# Patient Record
Sex: Male | Born: 1965
Health system: Southern US, Community
[De-identification: ages and names within clinical notes are randomized; demographics above are authoritative.]

## PROBLEM LIST (undated history)

## (undated) DIAGNOSIS — G4730 Sleep apnea, unspecified: Secondary | ICD-10-CM

## (undated) DIAGNOSIS — J302 Other seasonal allergic rhinitis: Secondary | ICD-10-CM

## (undated) DIAGNOSIS — J329 Chronic sinusitis, unspecified: Secondary | ICD-10-CM

---

## 2011-06-09 HISTORY — PX: NASAL POLYP SURGERY: SHX186

## 2012-02-11 ENCOUNTER — Other Ambulatory Visit (HOSPITAL_COMMUNITY): Payer: Self-pay | Admitting: General Surgery

## 2012-02-11 DIAGNOSIS — IMO0002 Reserved for concepts with insufficient information to code with codable children: Secondary | ICD-10-CM

## 2012-02-15 ENCOUNTER — Other Ambulatory Visit (HOSPITAL_COMMUNITY): Payer: Self-pay | Admitting: General Surgery

## 2012-02-15 ENCOUNTER — Ambulatory Visit (HOSPITAL_COMMUNITY)
Admission: RE | Admit: 2012-02-15 | Discharge: 2012-02-15 | Disposition: A | Discharge status: Home / Self Care | Payer: Self-pay | Source: Ambulatory Visit | Attending: General Surgery | Admitting: General Surgery

## 2012-02-15 DIAGNOSIS — IMO0002 Reserved for concepts with insufficient information to code with codable children: Secondary | ICD-10-CM

## 2012-02-15 DIAGNOSIS — R1903 Right lower quadrant abdominal swelling, mass and lump: Secondary | ICD-10-CM | POA: Insufficient documentation

## 2012-02-15 MED ORDER — GADOBENATE DIMEGLUMINE 529 MG/ML IV SOLN
20.0000 mL | Freq: Once | INTRAVENOUS | Status: AC | PRN
  Administered 2012-02-15: 20 mL via INTRAVENOUS

## 2012-06-14 ENCOUNTER — Encounter (HOSPITAL_COMMUNITY): Payer: Self-pay | Admitting: Pharmacy Technician

## 2012-06-15 NOTE — Consult Note (Signed)
Terry Gonzalez, SCALES NO.:  [id number]  MEDICAL RECORD NO.:  [id number]  LOCATION:  [facility]                         FACILITY:  [facility]  PHYSICIAN:  Barbaraann Barthel, M.D. DATE OF BIRTH:  10-03-1965  DATE OF CONSULTATION:  06/14/2012 DATE OF DISCHARGE:                                CONSULTATION   DIAGNOSIS:  Abdominal mass, left lower quadrant.  HISTORY OF PRESENT MEDICAL ILLNESS:  The patient has had a mass that has increased in size over the last year.  I saw him in September and MRI showed large subcutaneous mass in the left lower quadrant.  Surgical exploration was advised. He has been unable to do this earlier and we planned to do this as an outpatient when he was able to do so.  He has had no fever or chills associated with this.  No loss of weight or other systemic symptoms.  PAST MEDICAL HISTORY:  Unremarkable.  PAST SURGICAL HISTORY:  In 2013, he had nasal polyps surgically removed.  He has no known allergies.  MEDICATIONS:  For his medications see medical med sheet.  He smokes 3 cigarettes a day, no more than this.  He is practicing [employer], does not drink.  PHYSICAL EXAMINATION:  VITAL SIGNS:  He is 6 feet tall, weighs 232 pounds, temperature is 98.1, pulse rate is 85 and regular, respirations 24, blood pressure 131/86. HEENT:  Head is normocephalic.  Eyes, extraocular movements are intact. Nasal mucosa appears somewhat erythematous.  There is a history of past nasal polyp surgery.  His oral and nasal mucosa is moist. NECK:  Supple and cylindrical without jugular vein distention, thyromegaly, tracheal deviation, or the presence of bruits or adenopathy. CHEST:  Clear, both anterior and posterior auscultation. HEART:  Regular rhythm. ABDOMEN  Soft.  The patient has no femoral or inguinal hernias appreciated.  He has a mass appreciated in the left lower quadrant.  It feels smooth and firm and mobile.  We have coordinated these findings with the  MRI. RECTAL EXAMINATION:  Deferred. EXTREMITIES:  Within normal limits.  REVIEW OF SYSTEMS:  NEURO SYSTEM:  No history of migraines or seizures. ENDOCRINE SYSTEM:  No history of diabetes or thyroid disease. CARDIOPULMONARY SYSTEM:  Grossly within normal limits.  Mild history of tobacco use, but not abuse.  MUSCULOSKELETAL SYSTEM:  The patient is overweight.  GI SYSTEM:  No history of hepatitis, constipation, diarrhea, or bright red rectal bleeding.  History of bowel changes or history of any acute inflammatory bowel disease or irritable bowel syndrome.  No unexplained weight loss.  The patient has never had a colonoscopy.  GU SYSTEM:  No history of dysuria or frequency or nephrolithiasis.  REVIEW OF HISTORY AND PHYSICAL:  Therefore, Mr. Nicolini is a 47 year old, [place] American who has a mass noted for about a year in his left lower quadrant and we will plan for surgical exploration.  We discussed complications not limited to, but including bleeding, infection, and the possibility of further surgery might be required.  Informed consent was obtained.     Barbaraann Barthel, M.D.     WB/MEDQ  D:  06/14/2012  T:  06/15/2012  Job:  [id number]

## 2012-06-16 ENCOUNTER — Other Ambulatory Visit (HOSPITAL_COMMUNITY): Payer: Self-pay

## 2012-06-16 NOTE — Patient Instructions (Addendum)
    Bryton Pals  06/16/2012   Your procedure is scheduled on:   06/20/2012  Report to [facility] at  615  AM.  Call this number if you have problems the morning of surgery: [phone]   Remember:   Do not eat food or drink liquids after midnight.   Take these medicines the morning of surgery with A SIP OF WATER: none   Do not wear jewelry, make-up or nail polish.  Do not wear lotions, powders, or perfumes.   Do not shave 48 hours prior to surgery. Men may shave face and neck.  Do not bring valuables to the hospital.  Contacts, dentures or bridgework may not be worn into surgery.  Leave suitcase in the car. After surgery it may be brought to your room.  For patients admitted to the hospital, checkout time is 11:00 AM the day of discharge.   Patients discharged the day of surgery will not be allowed to drive  home.  Name and phone number of your driver: family  Special Instructions: Shower using CHG 2 nights before surgery and the night before surgery.  If you shower the day of surgery use CHG.  Use special wash - you have one bottle of CHG for all showers.  You should use approximately 1/3 of the bottle for each shower.   Please read over the following fact sheets that you were given: Pain Booklet, Coughing and Deep Breathing, MRSA Information, Surgical Site Infection Prevention, Anesthesia Post-op Instructions and Care and Recovery After Surgery PATIENT INSTRUCTIONS POST-ANESTHESIA  IMMEDIATELY FOLLOWING SURGERY:  Do not drive or operate machinery for the first twenty four hours after surgery.  Do not make any important decisions for twenty four hours after surgery or while taking narcotic pain medications or sedatives.  If you develop intractable nausea and vomiting or a severe headache please notify your doctor immediately.  FOLLOW-UP:  Please make an appointment with your surgeon as instructed. You do not need to follow up with anesthesia unless specifically instructed to do so.  WOUND  CARE INSTRUCTIONS (if applicable):  Keep a dry clean dressing on the anesthesia/puncture wound site if there is drainage.  Once the wound has quit draining you may leave it open to air.  Generally you should leave the bandage intact for twenty four hours unless there is drainage.  If the epidural site drains for more than 36-48 hours please call the anesthesia department.  QUESTIONS?:  Please feel free to call your physician or the hospital operator if you have any questions, and they will be happy to assist you.

## 2012-06-17 ENCOUNTER — Encounter (HOSPITAL_COMMUNITY): Payer: Self-pay

## 2012-06-17 ENCOUNTER — Encounter (HOSPITAL_COMMUNITY)
Admission: RE | Admit: 2012-06-17 | Discharge: 2012-06-17 | Disposition: A | Discharge status: Home / Self Care | Payer: Self-pay | Source: Ambulatory Visit | Attending: General Surgery | Admitting: General Surgery

## 2012-06-17 HISTORY — DX: Sleep apnea, unspecified: G47.30

## 2012-06-17 LAB — BASIC METABOLIC PANEL
BUN: 12 mg/dL (ref 6–23)
CO2: 28 mEq/L (ref 19–32)
Calcium: 9.3 mg/dL (ref 8.4–10.5)
Creatinine, Ser: 0.87 mg/dL (ref 0.50–1.35)
GFR calc non Af Amer: >90 mL/min (ref >90)
Glucose, Bld: 113 mg/dL — ABNORMAL HIGH (ref 70–99)
Sodium: 138 mEq/L (ref 135–145)

## 2012-06-17 LAB — CBC WITH DIFFERENTIAL/PLATELET
Eosinophils Absolute: 0.3 10*3/uL (ref 0.0–0.7)
Eosinophils Relative: 7 % — ABNORMAL HIGH (ref 0–5)
HCT: 43.7 % (ref 39.0–52.0)
Lymphocytes Relative: 57 % — ABNORMAL HIGH (ref 12–46)
Lymphs Abs: 2.3 10*3/uL (ref 0.7–4.0)
MCH: 30.2 pg (ref 26.0–34.0)
MCV: 86.2 fL (ref 78.0–100.0)
Monocytes Absolute: 0.4 10*3/uL (ref 0.1–1.0)
Platelets: 238 10*3/uL (ref 150–400)
RBC: 5.07 MIL/uL (ref 4.22–5.81)
WBC: 4.1 10*3/uL (ref 4.0–10.5)

## 2012-06-17 NOTE — Progress Notes (Signed)
06/17/12 1017  OBSTRUCTIVE SLEEP APNEA  Have you ever been diagnosed with sleep apnea through a sleep study? No  Do you snore loudly (loud enough to be heard through closed doors)?  1  Do you often feel tired, fatigued, or sleepy during the daytime? 1  Has anyone observed you stop breathing during your sleep? 1  Do you have, or are you being treated for high blood pressure? 0  BMI more than 35 kg/m2? 0  Age over 49 years old? 0  Neck circumference greater than 40 cm/18 inches? 1  Gender: 1  Obstructive Sleep Apnea Score 5   Score 4 or greater  Results sent to PCP;No PCP

## 2012-06-20 ENCOUNTER — Encounter (HOSPITAL_COMMUNITY): Payer: Self-pay | Admitting: Anesthesiology

## 2012-06-20 ENCOUNTER — Ambulatory Visit (HOSPITAL_COMMUNITY)
Admission: RE | Admit: 2012-06-20 | Discharge: 2012-06-20 | Disposition: A | Discharge status: Home / Self Care | Payer: Self-pay | Source: Ambulatory Visit | Attending: General Surgery | Admitting: General Surgery

## 2012-06-20 ENCOUNTER — Encounter (HOSPITAL_COMMUNITY): Payer: Self-pay | Admitting: *Deleted

## 2012-06-20 ENCOUNTER — Encounter (HOSPITAL_COMMUNITY)
Admission: RE | Disposition: A | Discharge status: Home / Self Care | Payer: Self-pay | Source: Ambulatory Visit | Attending: General Surgery

## 2012-06-20 ENCOUNTER — Ambulatory Visit (HOSPITAL_COMMUNITY): Payer: Self-pay | Admitting: Anesthesiology

## 2012-06-20 DIAGNOSIS — R1904 Left lower quadrant abdominal swelling, mass and lump: Secondary | ICD-10-CM | POA: Insufficient documentation

## 2012-06-20 DIAGNOSIS — D214 Benign neoplasm of connective and other soft tissue of abdomen: Secondary | ICD-10-CM | POA: Insufficient documentation

## 2012-06-20 HISTORY — PX: MASS EXCISION: SHX2000

## 2012-06-20 SURGERY — EXCISION MASS
Anesthesia: General | Site: Abdomen | Laterality: Left | Wound class: Clean

## 2012-06-20 MED ORDER — MIDAZOLAM HCL 2 MG/2ML IJ SOLN
1.0000 mg | INTRAMUSCULAR | Status: DC | PRN
  Administered 2012-06-20: 2 mg via INTRAVENOUS

## 2012-06-20 MED ORDER — FENTANYL CITRATE 0.05 MG/ML IJ SOLN
INTRAMUSCULAR | Status: DC | PRN
  Administered 2012-06-20 (×2): 50 ug via INTRAVENOUS

## 2012-06-20 MED ORDER — LIDOCAINE HCL (PF) 1 % IJ SOLN
INTRAMUSCULAR | Status: AC
  Filled 2012-06-20: qty 5

## 2012-06-20 MED ORDER — CEFAZOLIN SODIUM-DEXTROSE 2-3 GM-% IV SOLR
INTRAVENOUS | Status: AC
  Filled 2012-06-20: qty 50

## 2012-06-20 MED ORDER — CEFAZOLIN SODIUM-DEXTROSE 2-3 GM-% IV SOLR: 2.0000 g | INTRAVENOUS | Status: DC

## 2012-06-20 MED ORDER — 0.9 % SODIUM CHLORIDE (POUR BTL) OPTIME
TOPICAL | Status: DC | PRN
  Administered 2012-06-20: 1000 mL

## 2012-06-20 MED ORDER — FENTANYL CITRATE 0.05 MG/ML IJ SOLN
INTRAMUSCULAR | Status: AC
  Filled 2012-06-20: qty 2

## 2012-06-20 MED ORDER — MIDAZOLAM HCL 2 MG/2ML IJ SOLN
INTRAMUSCULAR | Status: AC
  Filled 2012-06-20: qty 2

## 2012-06-20 MED ORDER — LIDOCAINE HCL 1 % IJ SOLN
INTRAMUSCULAR | Status: DC | PRN
  Administered 2012-06-20: 50 mg via INTRADERMAL

## 2012-06-20 MED ORDER — BUPIVACAINE HCL (PF) 0.5 % IJ SOLN
INTRAMUSCULAR | Status: AC
  Filled 2012-06-20: qty 30

## 2012-06-20 MED ORDER — ONDANSETRON HCL 4 MG/2ML IJ SOLN
4.0000 mg | Freq: Once | INTRAMUSCULAR | Status: AC
  Administered 2012-06-20: 4 mg via INTRAVENOUS

## 2012-06-20 MED ORDER — CEFAZOLIN SODIUM-DEXTROSE 2-3 GM-% IV SOLR
INTRAVENOUS | Status: DC | PRN
  Administered 2012-06-20: 2 g via INTRAVENOUS

## 2012-06-20 MED ORDER — LACTATED RINGERS IV SOLN
INTRAVENOUS | Status: DC
  Administered 2012-06-20: 1000 mL via INTRAVENOUS

## 2012-06-20 MED ORDER — MIDAZOLAM HCL 5 MG/5ML IJ SOLN
INTRAMUSCULAR | Status: DC | PRN
  Administered 2012-06-20: 2 mg via INTRAVENOUS

## 2012-06-20 MED ORDER — PROPOFOL 10 MG/ML IV EMUL
INTRAVENOUS | Status: AC
  Filled 2012-06-20: qty 20

## 2012-06-20 MED ORDER — PROPOFOL 10 MG/ML IV BOLUS
INTRAVENOUS | Status: DC | PRN
  Administered 2012-06-20: 150 mg via INTRAVENOUS

## 2012-06-20 MED ORDER — ONDANSETRON HCL 4 MG/2ML IJ SOLN: 4.0000 mg | Freq: Once | INTRAMUSCULAR | Status: DC | PRN

## 2012-06-20 MED ORDER — ONDANSETRON HCL 4 MG/2ML IJ SOLN
INTRAMUSCULAR | Status: AC
  Filled 2012-06-20: qty 2

## 2012-06-20 MED ORDER — FENTANYL CITRATE 0.05 MG/ML IJ SOLN
25.0000 ug | INTRAMUSCULAR | Status: DC | PRN
  Administered 2012-06-20: 25 ug via INTRAVENOUS

## 2012-06-20 MED ORDER — STERILE WATER FOR IRRIGATION IR SOLN
Status: DC | PRN
  Administered 2012-06-20: 1000 mL

## 2012-06-20 SURGICAL SUPPLY — 30 items
BAG HAMPER (MISCELLANEOUS) ×3 IMPLANT
BANDAGE CONFORM 2  STR LF (GAUZE/BANDAGES/DRESSINGS) IMPLANT
CLOTH BEACON ORANGE TIMEOUT ST (SAFETY) ×3 IMPLANT
COVER LIGHT HANDLE STERIS (MISCELLANEOUS) ×6 IMPLANT
ELECT REM PT RETURN 9FT ADLT (ELECTROSURGICAL) ×3
ELECTRODE REM PT RTRN 9FT ADLT (ELECTROSURGICAL) ×2 IMPLANT
GLOVE BIOGEL PI IND STRL 7.0 (GLOVE) ×2 IMPLANT
GLOVE BIOGEL PI INDICATOR 7.0 (GLOVE) ×1
GLOVE ECLIPSE 6.5 STRL STRAW (GLOVE) ×3 IMPLANT
GLOVE EXAM NITRILE MD LF STRL (GLOVE) ×3 IMPLANT
GLOVE SKINSENSE NS SZ7.0 (GLOVE) ×1
GLOVE SKINSENSE STRL SZ7.0 (GLOVE) ×2 IMPLANT
GLOVE SS BIOGEL STRL SZ 6.5 (GLOVE) ×4 IMPLANT
GLOVE SUPERSENSE BIOGEL SZ 6.5 (GLOVE) ×2
GOWN STRL REIN XL XLG (GOWN DISPOSABLE) ×9 IMPLANT
KIT ROOM TURNOVER APOR (KITS) ×3 IMPLANT
MANIFOLD NEPTUNE II (INSTRUMENTS) ×3 IMPLANT
MARKER SKIN DUAL TIP RULER LAB (MISCELLANEOUS) ×3 IMPLANT
NS IRRIG 1000ML POUR BTL (IV SOLUTION) ×3 IMPLANT
PACK BASIC LIMB (CUSTOM PROCEDURE TRAY) IMPLANT
PACK MINOR (CUSTOM PROCEDURE TRAY) ×3 IMPLANT
PAD ABD 5X9 TENDERSORB (GAUZE/BANDAGES/DRESSINGS) IMPLANT
PAD ARMBOARD 7.5X6 YLW CONV (MISCELLANEOUS) ×3 IMPLANT
SET BASIN LINEN APH (SET/KITS/TRAYS/PACK) ×3 IMPLANT
SPONGE GAUZE 4X4 12PLY (GAUZE/BANDAGES/DRESSINGS) ×3 IMPLANT
SWAB CULTURE LIQ STUART DBL (MISCELLANEOUS) IMPLANT
SYR BULB IRRIGATION 50ML (SYRINGE) ×3 IMPLANT
TAPE CLOTH SURG 4X10 WHT LF (GAUZE/BANDAGES/DRESSINGS) ×3 IMPLANT
TOWEL OR 17X26 4PK STRL BLUE (TOWEL DISPOSABLE) IMPLANT
WATER STERILE IRR 1000ML POUR (IV SOLUTION) ×3 IMPLANT

## 2012-06-20 NOTE — Transfer of Care (Signed)
Immediate Anesthesia Transfer of Care Note  Patient: Terry Gonzalez  Procedure(s) Performed: Procedure(s) (LRB) with comments: EXCISION MASS (Left) - Excision of Mass Left Lower Quadrant  Patient Location: PACU  Anesthesia Type:General  Level of Consciousness: awake and patient cooperative  Airway & Oxygen Therapy: Patient Spontanous Breathing and Patient connected to face mask oxygen  Post-op Assessment: Report given to PACU RN, Post -op Vital signs reviewed and stable and Patient moving all extremities  Post vital signs: Reviewed and stable  Complications: No apparent anesthesia complications

## 2012-06-20 NOTE — Progress Notes (Signed)
46 yr. Old male with large extraperitoneal mass for excisional biopsy.  Procedure and risks explained and informed consent obtained.  Labs reviewed and there has been no clinical change in status since H&P.  Dict. # [id number]. Filed Vitals:   06/20/12 0730  BP: 101/75  Pulse:   Temp:   Resp: 27  pulse 66 temp. 97.4

## 2012-06-20 NOTE — Anesthesia Postprocedure Evaluation (Signed)
  Anesthesia Post-op Note  Patient: Terry Gonzalez  Procedure(s) Performed: Procedure(s) (LRB) with comments: EXCISION MASS (Left) - Excision of Mass Left Lower Quadrant  Patient Location: PACU  Anesthesia Type:General  Level of Consciousness: awake, alert , oriented and patient cooperative  Airway and Oxygen Therapy: Patient Spontanous Breathing  Post-op Pain: 3 /10, mild  Post-op Assessment: Post-op Vital signs reviewed, Patient's Cardiovascular Status Stable, Respiratory Function Stable, Patent Airway, No signs of Nausea or vomiting and Pain level controlled  Post-op Vital Signs: Reviewed and stable  Complications: No apparent anesthesia complications

## 2012-06-20 NOTE — Anesthesia Preprocedure Evaluation (Signed)
Anesthesia Evaluation  Patient identified by MRN, date of birth, ID band Patient awake    Reviewed: Allergy & Precautions, H&P , NPO status , Patient's Chart, lab work & pertinent test results  History of Anesthesia Complications Negative for: history of anesthetic complications  Airway Mallampati: II TM Distance: >3 FB     Dental  (+) Teeth Intact and Loose,    Pulmonary neg pulmonary ROS,  Nasal congestion breath sounds clear to auscultation        Cardiovascular negative cardio ROS  Rhythm:Regular Rate:Normal     Neuro/Psych    GI/Hepatic negative GI ROS,   Endo/Other    Renal/GU      Musculoskeletal   Abdominal   Peds  Hematology   Anesthesia Other Findings   Reproductive/Obstetrics                           Anesthesia Physical Anesthesia Plan  ASA: I  Anesthesia Plan: General   Post-op Pain Management:    Induction: Intravenous  Airway Management Planned: LMA  Additional Equipment:   Intra-op Plan:   Post-operative Plan: Extubation in OR  Informed Consent: I have reviewed the patients History and Physical, chart, labs and discussed the procedure including the risks, benefits and alternatives for the proposed anesthesia with the patient or authorized representative who has indicated his/her understanding and acceptance.     Plan Discussed with:   Anesthesia Plan Comments:         Anesthesia Quick Evaluation

## 2012-06-20 NOTE — Anesthesia Procedure Notes (Signed)
Procedure Name: LMA Insertion Date/Time: 06/20/2012 7:57 AM Performed by: Despina Hidden Pre-anesthesia Checklist: Patient identified, Patient being monitored, Emergency Drugs available and Suction available Patient Re-evaluated:Patient Re-evaluated prior to inductionOxygen Delivery Method: Circle system utilized Preoxygenation: Pre-oxygenation with 100% oxygen Intubation Type: IV induction LMA: LMA inserted LMA Size: 4.0 Grade View: Grade I Number of attempts: 1 Tube secured with: Tape Dental Injury: Teeth and Oropharynx as per pre-operative assessment

## 2012-06-20 NOTE — Brief Op Note (Signed)
06/20/2012  8:57 AM  PATIENT:  Terry Gonzalez  47 y.o. male  PRE-OPERATIVE DIAGNOSIS:  abdominal mass left lower quadrant  POST-OPERATIVE DIAGNOSIS:  abdominal mass left lower quadrant  PROCEDURE:  Procedure(s) (LRB) with comments: EXCISION MASS (Left) - Excision of Mass Left Lower Quadrant  SURGEON:  Surgeon(s) and Role:    * Marlane Hatcher, MD - Primary  PHYSICIAN ASSISTANT:   ASSISTANTS: none   ANESTHESIA:   general  EBL:  Total I/O In: 900 [I.V.:900] Out: -   BLOOD ADMINISTERED:none  DRAINS: none   LOCAL MEDICATIONS USED:  NONE  SPECIMEN:  Source of Specimen:  Left lower quadrant mass,extraperitoneal.  DISPOSITION OF SPECIMEN:  PATHOLOGY  Frozen section: myxoid neoplasm, diagnosis deferred.  COUNTS:  YES  TOURNIQUET:  * No tourniquets in log *  DICTATION: .Other Dictation: Dictation Number OR dict. # [id number].  PLAN OF CARE: Discharge to home after PACU  PATIENT DISPOSITION:  PACU - hemodynamically stable.   Delay start of Pharmacological VTE agent (>24hrs) due to surgical blood loss or risk of bleeding: not applicable

## 2012-06-21 NOTE — Op Note (Signed)
Terry Gonzalez, Terry Gonzalez NO.:  [id number]  MEDICAL RECORD NO.:  [id number]  LOCATION:  [facility]                          FACILITY:  [facility]  PHYSICIAN:  Barbaraann Barthel, M.D. DATE OF BIRTH:  08/26/1965  DATE OF PROCEDURE:  06/20/2012 DATE OF DISCHARGE:  06/20/2012                              OPERATIVE REPORT   DIAGNOSES: 1. Left lower quadrant mass. 2. Frozen section myxoid neoplasm, await final pathology.  WOUND CLASSIFICATION:  Clean.  NOTE:  This is a 47 year old [place] American male, who had a mass palpated in his left lower quadrant that had been present to his estimation approximately 1 year's time.  He did not particularly notice any increase in size, but he continued to have some discomfort in his left lower quadrant.  I examined him, the mass felt smooth with and it was movable and there was no other associated masses or lymphadenopathy. He had an MRI which revealed a complex mass that was extraperitoneal in the subcutaneous space just superior to the recti fascia.  We discussed excisional biopsy with him discussing complications not limited to, but including bleeding, infection, and the possibility of further surgery might be required depending upon the diagnosis. Informed consent was obtained.  TECHNIQUE:  The patient was placed in supine position and after the adequate administration of general anesthesia, his abdomen was prepped with Betadine solution and draped in usual manner.  I had marked the limits of the mass and made an incision over the most prominent palpable portion of it removing in toto an encapsulated firm mass that appeared fibrous and appeared to have a thin capsule around it.  This was approximately 7 cm x 4 cm and approximately 2 cm deep.  The frozen section revealed that this was a myxoid neoplasm and final diagnosis was deferred.  The wound was then irrigated.  The bleeding was controlled with a cautery device.  This was  minimal.  The patient loss less than 20 mL of blood.  The wound was irrigated with normal saline solution.  He received approximately a liter of crystalloids intraoperatively.  No drains were placed.  There were no complications.  The patient was taken back to the recovery room in satisfactory condition.     Barbaraann Barthel, M.D.     WB/MEDQ  D:  06/20/2012  T:  06/21/2012  Job:  [id number]

## 2012-06-23 ENCOUNTER — Other Ambulatory Visit: Payer: Self-pay

## 2012-06-23 ENCOUNTER — Encounter (HOSPITAL_COMMUNITY): Payer: Self-pay | Admitting: General Surgery

## 2012-06-23 DIAGNOSIS — G4700 Insomnia, unspecified: Secondary | ICD-10-CM

## 2012-06-27 ENCOUNTER — Ambulatory Visit: Payer: Self-pay | Attending: Otolaryngology | Admitting: Sleep Medicine

## 2012-06-27 DIAGNOSIS — G4733 Obstructive sleep apnea (adult) (pediatric): Secondary | ICD-10-CM | POA: Insufficient documentation

## 2012-06-27 DIAGNOSIS — Z6833 Body mass index (BMI) 33.0-33.9, adult: Secondary | ICD-10-CM | POA: Insufficient documentation

## 2012-06-27 DIAGNOSIS — G4700 Insomnia, unspecified: Secondary | ICD-10-CM

## 2012-07-11 NOTE — Procedures (Signed)
[facility] Krystl Wickware A. Gerilyn Pilgrim, MD     [email or web address]        NAMEMAKSYM, Terry Gonzalez                ACCOUNT NO.:  [id number]  MEDICAL RECORD NO.:  [id number]          PATIENT TYPE:  OUT  LOCATION:  SLEEP LAB                     FACILITY:  [facility]  PHYSICIAN:  Kameo Bains A. Gerilyn Pilgrim, M.D. DATE OF BIRTH:  1965-10-07  DATE OF STUDY:  07/28/2012                           NOCTURNAL POLYSOMNOGRAM  REFERRING PHYSICIAN:  Benito Mccreedy  INDICATION:  A 47 year old man who presents with hypersomnia, fatigue, and snoring.  EPWORTH SLEEPINESS SCORE:  10.  BMI:  33.  ARCHITECTURAL SUMMARY:  This is a split night recording with initial portion being a diagnostic and second portion a titration recording. The total recording time is 395 minutes.  Sleep efficiency 74%, sleep latency 12 minutes.  REM latency 85 minutes.  Stage N1 4%, N2 48%, N3 36%, and REM sleep 11%.  RESPIRATORY SUMMARY:  Baseline oxygen saturation is 95, lowest saturation 77 during REM sleep.  Diagnostic AHI is 68.  The patient placed on positive pressure between 5 and 14.  Optimal pressure 14 with resolution of events and good tolerance.  LIMB MOVEMENT SUMMARY:  PLM index 0.  ELECTROCARDIOGRAM SUMMARY:  Average heart rate is 65 with no significant dysrhythmias observed.  IMPRESSION:  Severe obstructive sleep apnea syndrome which responds well to CPAP of 12.  Thanks for this referral.   Zeb Rawl A. Gerilyn Pilgrim, M.D.    KAD/MEDQ  D:  07/11/2012 09:37:09  T:  07/11/2012 09:52:47  Job:  [id number]

## 2013-06-22 ENCOUNTER — Ambulatory Visit (INDEPENDENT_AMBULATORY_CARE_PROVIDER_SITE_OTHER): Payer: BC Managed Care – PPO | Admitting: Otolaryngology

## 2013-06-22 DIAGNOSIS — J342 Deviated nasal septum: Secondary | ICD-10-CM

## 2013-06-22 DIAGNOSIS — J330 Polyp of nasal cavity: Secondary | ICD-10-CM

## 2013-06-22 DIAGNOSIS — J310 Chronic rhinitis: Secondary | ICD-10-CM

## 2013-06-22 DIAGNOSIS — J343 Hypertrophy of nasal turbinates: Secondary | ICD-10-CM

## 2013-06-23 ENCOUNTER — Other Ambulatory Visit (INDEPENDENT_AMBULATORY_CARE_PROVIDER_SITE_OTHER): Payer: Self-pay | Admitting: Otolaryngology

## 2013-06-23 DIAGNOSIS — J329 Chronic sinusitis, unspecified: Secondary | ICD-10-CM

## 2013-06-27 ENCOUNTER — Ambulatory Visit (HOSPITAL_COMMUNITY)
Admission: RE | Admit: 2013-06-27 | Discharge: 2013-06-27 | Disposition: A | Discharge status: Home / Self Care | Payer: BC Managed Care – PPO | Source: Ambulatory Visit | Attending: Otolaryngology | Admitting: Otolaryngology

## 2013-06-27 DIAGNOSIS — J320 Chronic maxillary sinusitis: Secondary | ICD-10-CM | POA: Insufficient documentation

## 2013-06-27 DIAGNOSIS — J322 Chronic ethmoidal sinusitis: Secondary | ICD-10-CM | POA: Insufficient documentation

## 2013-06-27 DIAGNOSIS — J321 Chronic frontal sinusitis: Secondary | ICD-10-CM | POA: Insufficient documentation

## 2013-06-27 DIAGNOSIS — J329 Chronic sinusitis, unspecified: Secondary | ICD-10-CM

## 2013-07-13 ENCOUNTER — Ambulatory Visit (INDEPENDENT_AMBULATORY_CARE_PROVIDER_SITE_OTHER): Payer: BC Managed Care – PPO | Admitting: Otolaryngology

## 2013-07-13 DIAGNOSIS — J322 Chronic ethmoidal sinusitis: Secondary | ICD-10-CM

## 2013-07-13 DIAGNOSIS — J330 Polyp of nasal cavity: Secondary | ICD-10-CM

## 2013-07-13 DIAGNOSIS — J321 Chronic frontal sinusitis: Secondary | ICD-10-CM

## 2013-07-13 DIAGNOSIS — J320 Chronic maxillary sinusitis: Secondary | ICD-10-CM

## 2013-07-19 ENCOUNTER — Encounter (HOSPITAL_BASED_OUTPATIENT_CLINIC_OR_DEPARTMENT_OTHER): Payer: Self-pay | Admitting: *Deleted

## 2013-07-19 NOTE — Progress Notes (Signed)
Did review with dr crews-can be done as long as he uses cpap

## 2013-07-19 NOTE — Progress Notes (Signed)
No labs needed-will bring cpap-will use post op-mask over mouth and nose

## 2013-07-24 ENCOUNTER — Encounter (HOSPITAL_BASED_OUTPATIENT_CLINIC_OR_DEPARTMENT_OTHER)
Admission: RE | Disposition: A | Discharge status: Home / Self Care | Payer: Self-pay | Source: Ambulatory Visit | Attending: Otolaryngology

## 2013-07-24 ENCOUNTER — Ambulatory Visit (HOSPITAL_BASED_OUTPATIENT_CLINIC_OR_DEPARTMENT_OTHER): Payer: BC Managed Care – PPO | Admitting: Anesthesiology

## 2013-07-24 ENCOUNTER — Encounter (HOSPITAL_BASED_OUTPATIENT_CLINIC_OR_DEPARTMENT_OTHER): Payer: BC Managed Care – PPO | Admitting: Anesthesiology

## 2013-07-24 ENCOUNTER — Encounter (HOSPITAL_BASED_OUTPATIENT_CLINIC_OR_DEPARTMENT_OTHER): Payer: Self-pay | Admitting: *Deleted

## 2013-07-24 ENCOUNTER — Ambulatory Visit (HOSPITAL_BASED_OUTPATIENT_CLINIC_OR_DEPARTMENT_OTHER)
Admission: RE | Admit: 2013-07-24 | Discharge: 2013-07-24 | Disposition: A | Discharge status: Home / Self Care | Payer: BC Managed Care – PPO | Source: Ambulatory Visit | Attending: Otolaryngology | Admitting: Otolaryngology

## 2013-07-24 DIAGNOSIS — Z9889 Other specified postprocedural states: Secondary | ICD-10-CM

## 2013-07-24 DIAGNOSIS — J322 Chronic ethmoidal sinusitis: Secondary | ICD-10-CM | POA: Insufficient documentation

## 2013-07-24 DIAGNOSIS — J320 Chronic maxillary sinusitis: Secondary | ICD-10-CM | POA: Insufficient documentation

## 2013-07-24 DIAGNOSIS — F17200 Nicotine dependence, unspecified, uncomplicated: Secondary | ICD-10-CM | POA: Insufficient documentation

## 2013-07-24 DIAGNOSIS — J338 Other polyp of sinus: Secondary | ICD-10-CM | POA: Insufficient documentation

## 2013-07-24 DIAGNOSIS — G4730 Sleep apnea, unspecified: Secondary | ICD-10-CM | POA: Insufficient documentation

## 2013-07-24 DIAGNOSIS — J321 Chronic frontal sinusitis: Secondary | ICD-10-CM | POA: Insufficient documentation

## 2013-07-24 HISTORY — PX: SINUS ENDO W/FUSION: SHX777

## 2013-07-24 HISTORY — DX: Chronic sinusitis, unspecified: J32.9

## 2013-07-24 LAB — POCT HEMOGLOBIN-HEMACUE: Hemoglobin: 15.9 g/dL (ref 13.0–17.0)

## 2013-07-24 SURGERY — SINUS SURGERY, ENDOSCOPIC, USING COMPUTER-ASSISTED NAVIGATION
Anesthesia: General | Site: Nose | Laterality: Bilateral

## 2013-07-24 MED ORDER — LIDOCAINE-EPINEPHRINE 1 %-1:100000 IJ SOLN
INTRAMUSCULAR | Status: DC | PRN
  Administered 2013-07-24: 2 mL

## 2013-07-24 MED ORDER — HYDROMORPHONE HCL PF 1 MG/ML IJ SOLN
0.2500 mg | INTRAMUSCULAR | Status: DC | PRN
  Administered 2013-07-24: 0.25 mg via INTRAVENOUS
  Administered 2013-07-24: 0.5 mg via INTRAVENOUS
  Administered 2013-07-24: 0.25 mg via INTRAVENOUS

## 2013-07-24 MED ORDER — FENTANYL CITRATE 0.05 MG/ML IJ SOLN
INTRAMUSCULAR | Status: AC
  Filled 2013-07-24: qty 6

## 2013-07-24 MED ORDER — ONDANSETRON HCL 4 MG/2ML IJ SOLN
INTRAMUSCULAR | Status: DC | PRN
  Administered 2013-07-24: 4 mg via INTRAVENOUS

## 2013-07-24 MED ORDER — FENTANYL CITRATE 0.05 MG/ML IJ SOLN
50.0000 ug | INTRAMUSCULAR | Status: DC | PRN
Start: 2013-07-24 — End: 2013-07-24

## 2013-07-24 MED ORDER — HYDROMORPHONE HCL PF 1 MG/ML IJ SOLN
INTRAMUSCULAR | Status: AC
  Filled 2013-07-24: qty 1

## 2013-07-24 MED ORDER — OXYCODONE HCL 5 MG/5ML PO SOLN: 5.0000 mg | Freq: Once | ORAL | Status: DC | PRN

## 2013-07-24 MED ORDER — SUCCINYLCHOLINE CHLORIDE 20 MG/ML IJ SOLN
INTRAMUSCULAR | Status: DC | PRN
  Administered 2013-07-24: 120 mg via INTRAVENOUS

## 2013-07-24 MED ORDER — DEXAMETHASONE SODIUM PHOSPHATE 4 MG/ML IJ SOLN
INTRAMUSCULAR | Status: DC | PRN
  Administered 2013-07-24: 10 mg via INTRAVENOUS

## 2013-07-24 MED ORDER — FENTANYL CITRATE 0.05 MG/ML IJ SOLN
INTRAMUSCULAR | Status: DC | PRN
  Administered 2013-07-24: 100 ug via INTRAVENOUS
  Administered 2013-07-24 (×2): 50 ug via INTRAVENOUS

## 2013-07-24 MED ORDER — OXYMETAZOLINE HCL 0.05 % NA SOLN
NASAL | Status: AC
  Filled 2013-07-24: qty 15

## 2013-07-24 MED ORDER — AMOXICILLIN 875 MG PO TABS: 875.0000 mg | ORAL_TABLET | Freq: Two times a day (BID) | ORAL | Status: AC

## 2013-07-24 MED ORDER — CEFAZOLIN SODIUM-DEXTROSE 2-3 GM-% IV SOLR
INTRAVENOUS | Status: DC | PRN
  Administered 2013-07-24: 2 g via INTRAVENOUS

## 2013-07-24 MED ORDER — OXYMETAZOLINE HCL 0.05 % NA SOLN
NASAL | Status: DC | PRN
  Administered 2013-07-24 (×2): 1 via NASAL

## 2013-07-24 MED ORDER — MIDAZOLAM HCL 5 MG/5ML IJ SOLN
INTRAMUSCULAR | Status: DC | PRN
  Administered 2013-07-24: 2 mg via INTRAVENOUS

## 2013-07-24 MED ORDER — LIDOCAINE HCL (CARDIAC) 20 MG/ML IV SOLN
INTRAVENOUS | Status: DC | PRN
  Administered 2013-07-24: 100 mg via INTRAVENOUS

## 2013-07-24 MED ORDER — MUPIROCIN 2 % EX OINT
TOPICAL_OINTMENT | CUTANEOUS | Status: AC
  Filled 2013-07-24: qty 22

## 2013-07-24 MED ORDER — CEFAZOLIN SODIUM-DEXTROSE 2-3 GM-% IV SOLR
INTRAVENOUS | Status: AC
  Filled 2013-07-24: qty 50

## 2013-07-24 MED ORDER — MIDAZOLAM HCL 2 MG/2ML IJ SOLN
INTRAMUSCULAR | Status: AC
  Filled 2013-07-24: qty 2

## 2013-07-24 MED ORDER — LACTATED RINGERS IV SOLN
INTRAVENOUS | Status: DC
  Administered 2013-07-24 (×2): via INTRAVENOUS
  Administered 2013-07-24: 10 mL/h via INTRAVENOUS

## 2013-07-24 MED ORDER — MIDAZOLAM HCL 2 MG/2ML IJ SOLN: 1.0000 mg | INTRAMUSCULAR | Status: DC | PRN

## 2013-07-24 MED ORDER — LIDOCAINE-EPINEPHRINE 1 %-1:100000 IJ SOLN
INTRAMUSCULAR | Status: AC
  Filled 2013-07-24: qty 1

## 2013-07-24 MED ORDER — PROPOFOL 10 MG/ML IV BOLUS
INTRAVENOUS | Status: DC | PRN
  Administered 2013-07-24: 200 mg via INTRAVENOUS

## 2013-07-24 MED ORDER — OXYCODONE-ACETAMINOPHEN 5-325 MG PO TABS: 1.0000 | ORAL_TABLET | ORAL | Status: DC | PRN

## 2013-07-24 MED ORDER — MUPIROCIN 2 % EX OINT
TOPICAL_OINTMENT | CUTANEOUS | Status: DC | PRN
  Administered 2013-07-24: 1 via NASAL

## 2013-07-24 MED ORDER — OXYCODONE HCL 5 MG PO TABS: 5.0000 mg | ORAL_TABLET | Freq: Once | ORAL | Status: DC | PRN

## 2013-07-24 MED ORDER — BACITRACIN ZINC 500 UNIT/GM EX OINT
TOPICAL_OINTMENT | CUTANEOUS | Status: AC
  Filled 2013-07-24: qty 28.35

## 2013-07-24 SURGICAL SUPPLY — 52 items
BLADE ROTATE RAD 12 4 M4 (BLADE) IMPLANT
BLADE ROTATE RAD 40 4 M4 (BLADE) IMPLANT
BLADE ROTATE TRICUT 4X13 M4 (BLADE) ×2 IMPLANT
BLADE TRICUT ROTATE M4 4 5PK (BLADE) IMPLANT
BUR HS RAD FRONTAL 3 (BURR) IMPLANT
CANISTER SUC SOCK COL 7IN (MISCELLANEOUS) IMPLANT
CANISTER SUCT 1200ML W/VALVE (MISCELLANEOUS) ×2 IMPLANT
COAGULATOR SUCT 8FR VV (MISCELLANEOUS) ×2 IMPLANT
DECANTER SPIKE VIAL GLASS SM (MISCELLANEOUS) ×2 IMPLANT
DRAPE SURG 17X23 STRL (DRAPES) ×2 IMPLANT
DRSG NASAL KENNEDY LMNT 8CM (GAUZE/BANDAGES/DRESSINGS) IMPLANT
DRSG NASOPORE 8CM (GAUZE/BANDAGES/DRESSINGS) ×4 IMPLANT
DRSG TELFA 3X8 NADH (GAUZE/BANDAGES/DRESSINGS) IMPLANT
ELECT REM PT RETURN 9FT ADLT (ELECTROSURGICAL) ×2
ELECTRODE REM PT RTRN 9FT ADLT (ELECTROSURGICAL) ×1 IMPLANT
GAUZE SPONGE 4X4 16PLY XRAY LF (GAUZE/BANDAGES/DRESSINGS) ×2 IMPLANT
GLOVE BIO SURGEON STRL SZ 6.5 (GLOVE) ×2 IMPLANT
GLOVE BIO SURGEON STRL SZ7.5 (GLOVE) ×2 IMPLANT
GLOVE BIOGEL PI IND STRL 7.0 (GLOVE) ×1 IMPLANT
GLOVE BIOGEL PI INDICATOR 7.0 (GLOVE) ×1
GOWN STRL REUS W/ TWL LRG LVL3 (GOWN DISPOSABLE) ×2 IMPLANT
GOWN STRL REUS W/TWL LRG LVL3 (GOWN DISPOSABLE) ×2
HEMOSTAT SURGICEL 2X14 (HEMOSTASIS) IMPLANT
IMPL PROPEL SINUS 23MML (Prosthesis and Implant ENT) ×2 IMPLANT
IMPLANT PROPEL SINUS 23MML (Prosthesis and Implant ENT) ×4 IMPLANT
IV NS 1000ML (IV SOLUTION)
IV NS 1000ML BAXH (IV SOLUTION) IMPLANT
IV NS 500ML (IV SOLUTION) ×1
IV NS 500ML BAXH (IV SOLUTION) ×1 IMPLANT
NEEDLE 27GAX1X1/2 (NEEDLE) ×2 IMPLANT
NEEDLE SPNL 25GX3.5 QUINCKE BL (NEEDLE) IMPLANT
NS IRRIG 1000ML POUR BTL (IV SOLUTION) ×2 IMPLANT
PACK BASIN DAY SURGERY FS (CUSTOM PROCEDURE TRAY) ×2 IMPLANT
PACK ENT DAY SURGERY (CUSTOM PROCEDURE TRAY) ×2 IMPLANT
PAD ENT ADHESIVE 25PK (MISCELLANEOUS) ×2 IMPLANT
SHEATH ENDOSCRUB 0 DEG (SHEATH) IMPLANT
SHEATH ENDOSCRUB 30 DEG (SHEATH) IMPLANT
SLEEVE SCD COMPRESS KNEE MED (MISCELLANEOUS) ×2 IMPLANT
SOLUTION BUTLER CLEAR DIP (MISCELLANEOUS) ×2 IMPLANT
SPLINT NASAL DOYLE BI-VL (GAUZE/BANDAGES/DRESSINGS) IMPLANT
SPONGE GAUZE 2X2 8PLY STRL LF (GAUZE/BANDAGES/DRESSINGS) ×2 IMPLANT
SPONGE NEURO XRAY DETECT 1X3 (DISPOSABLE) ×2 IMPLANT
SUT ETHILON 3 0 PS 1 (SUTURE) IMPLANT
SUT PLAIN 4 0 ~~LOC~~ 1 (SUTURE) IMPLANT
SYR 3ML 23GX1 SAFETY (SYRINGE) IMPLANT
TOWEL OR 17X24 6PK STRL BLUE (TOWEL DISPOSABLE) ×4 IMPLANT
TRACKER ENT INSTRUMENT (MISCELLANEOUS) ×2 IMPLANT
TRACKER ENT PATIENT (MISCELLANEOUS) ×2 IMPLANT
TUBE CONNECTING 20X1/4 (TUBING) ×2 IMPLANT
TUBE SALEM SUMP 16 FR W/ARV (TUBING) ×2 IMPLANT
TUBING STRAIGHTSHOT EPS 5PK (TUBING) ×2 IMPLANT
YANKAUER SUCT BULB TIP NO VENT (SUCTIONS) ×2 IMPLANT

## 2013-07-24 NOTE — Op Note (Signed)
Terry Gonzalez, Terry Gonzalez NO.:  [id number]  MEDICAL RECORD NO.:  [id number]  LOCATION:                               FACILITY:  [facility]  PHYSICIAN:  Leta Baptist, MD            DATE OF BIRTH:  05/11/1966  DATE OF PROCEDURE:  07/24/2013 DATE OF DISCHARGE:  07/24/2013                              OPERATIVE REPORT   SURGEON:  Leta Baptist, MD  PREOPERATIVE DIAGNOSES: 1. Bilateral sinonasal polyps. 2. Bilateral chronic frontal sinusitis. 3. Bilateral chronic maxillary sinusitis. 4. Bilateral chronic ethmoid sinusitis.  POSTOPERATIVE DIAGNOSES: 1. Bilateral sinonasal polyps. 2. Bilateral chronic frontal sinusitis. 3. Bilateral chronic maxillary sinusitis. 4. Bilateral chronic ethmoid sinusitis.  PROCEDURE: 1. Endoscopic frontal recess, exploration with polyp removal. 2. Bilateral endoscopic total ethmoidectomy. 3. Bilateral endoscopic maxillary antrostomy with polyp removal. 4. FUSION stereotactic navigation.  ANESTHESIA:  General endotracheal tube anesthesia.  COMPLICATIONS:  None.  ESTIMATED BLOOD LOSS:  900 mL.  INDICATION FOR PROCEDURE:  The patient is a 48 year old male with a history of chronic rhinosinusitis and sinonasal polyps.  He was previously treated with allergy medications, including topical steroids, systemic steroids, antihistamines, and decongestants without improvement in his symptoms.  He continues to have chronic nasal obstruction, anosmia, and facial pressure.  It should be noted that he previously underwent bilateral endoscopic sinus surgery more than 1 year ago in [place], [place].  However his symptoms are recurred a few months after his surgery.  When the patient was evaluated in the office, he was noted to have persistent large bilateral sinonasal polyps.  His CT scan showed opacification of both maxillary sinuses, ethmoid sinuses, and frontal sinuses.  Based on the above findings, the decision was made for the patient to undergo the  above-stated procedures.  The risks, benefits, alternatives, and details of the procedures were discussed with the patient.  Questions were invited and answered.  Informed consent was obtained.  DESCRIPTION:  The patient was taken to the operating room and placed supine on the operating table.  General endotracheal tube anesthesia was administered by the anesthesiologist.  The patient was positioned and prepped and draped in the standard fashion for sinus surgery.  The fusion navigation marker was placed.  The navigation system was noted to be functional throughout the case.  Pledgets soaked with Afrin were placed in both nasal cavities for vasoconstriction.  The pledgets were subsequently removed.  Attention was first focused on the left side.  Using a 0 degree scope, the left nasal cavity was examined.  A large amount of polypoid tissue was noted to occupy essentially the entire nasal cavity. Photodocumentation of the polyps was obtained.  A 1% lidocaine with 1:100,000 epinephrine was injected onto the nasal polyp and the lateral nasal wall.  The polypoid tissue was removed with a combination of Blakesley forceps, Tru-Cut forceps, and microdebrider.  It should be noted that the patient's middle turbinate was previously resected.  The polypoid tissue was noted to occupy the entire maxillary sinus, ethmoid cavities, and the frontal sinus.  Attention was first focused on the maxillary sinus.  The maxillary opening was widely enlarged with a combination of  backbiter, Tru-Cut forceps, and microdebrider.  Large amount polyps was removed from the maxillary sinus.  The anterior ethmoid air cell was then entered with a suction catheter.  Polypoid tissue was also removed from the anterior ethmoid cavity.  The partition between the anterior and posterior ethmoid cavities were taken down with the microdebrider.  A polypoid tissue was also removed from the posterior ethmoid cavity.  Attention was  then focused on the facial recess.  It was carefully enlarged using a combination of Giraffe forceps, Tru-Cut forceps, and microdebrider.  A polypoid tissue was removed from the frontal recess.  All sinus cavities were copiously irrigated.  At this time, the patient's inferior turbinate was also noted to be severely hypertrophied.  The inferior 1/3rd of the inferior turbinate was then cross clamped and resected with a pair of cross cutting scissors.  In order to decrease the chance of significant polyp regrowth, a PROPEL steroid en-coated stent was then placed in the ethmoid cavities.  Hemostasis was achieved with nasal pore dressing. The same procedure was then repeated on the right side without exceptions.  Exactly the same findings were also noted on the right side, with polypoid tissue filling the maxillary sinus, anterior and posterior ethmoid cavities, and the frontal recess.  Another PROPEL stent placed.  The care of the patient was turned over to the anesthesiologist.  The patient was awakened from anesthesia without difficulty.  He was extubated and transferred to the recovery room in good condition.  OPERATIVE FINDINGS:  Large polypoid tissue was noted to obstruct the bilateral nasal cavities, bilateral maxillary, ethmoid, and frontal sinuses.  SPECIMEN:  Bilateral sinus contents.  FOLLOWUP CARE:  The patient will be discharged home once he is awake and alert.  He will be placed on prescribed Percocet p.r.n. pain and amoxicillin 875 mg p.o. b.i.d. for 5 days.  The patient will follow up in my office in 4 days.     Leta Baptist, MD     ST/MEDQ  D:  07/24/2013  T:  07/24/2013  Job:  [id number]

## 2013-07-24 NOTE — Anesthesia Preprocedure Evaluation (Signed)
Anesthesia Evaluation  Patient identified by MRN, date of birth, ID band Patient awake    Reviewed: Allergy & Precautions, H&P , NPO status , Patient's Chart, lab work & pertinent test results  Airway Mallampati: III TM Distance: >3 FB Neck ROM: Full    Dental no notable dental hx. (+) Teeth Intact, Dental Advisory Given   Pulmonary sleep apnea , Current Smoker,  breath sounds clear to auscultation  Pulmonary exam normal       Cardiovascular negative cardio ROS  Rhythm:Regular Rate:Normal     Neuro/Psych negative neurological ROS  negative psych ROS   GI/Hepatic negative GI ROS, Neg liver ROS,   Endo/Other  negative endocrine ROS  Renal/GU negative Renal ROS  negative genitourinary   Musculoskeletal   Abdominal   Peds  Hematology negative hematology ROS (+)   Anesthesia Other Findings   Reproductive/Obstetrics negative OB ROS                           Anesthesia Physical Anesthesia Plan  ASA: III  Anesthesia Plan: General   Post-op Pain Management:    Induction: Intravenous  Airway Management Planned: Oral ETT  Additional Equipment:   Intra-op Plan:   Post-operative Plan: Extubation in OR  Informed Consent: I have reviewed the patients History and Physical, chart, labs and discussed the procedure including the risks, benefits and alternatives for the proposed anesthesia with the patient or authorized representative who has indicated his/her understanding and acceptance.   Dental advisory given  Plan Discussed with: CRNA  Anesthesia Plan Comments:         Anesthesia Quick Evaluation

## 2013-07-24 NOTE — Op Note (Deleted)
Terry Gonzalez, Terry Gonzalez NO.:  [id number]  MEDICAL RECORD NO.:  [id number]  LOCATION:                               FACILITY:  [facility]  PHYSICIAN:  Leta Baptist, MD            DATE OF BIRTH:  Apr 23, 1966  DATE OF PROCEDURE:  07/24/2013 DATE OF DISCHARGE:  07/24/2013                              OPERATIVE REPORT   SURGEON:  Leta Baptist, MD  PREOPERATIVE DIAGNOSES: 1. Bilateral sinonasal polyps. 2. Bilateral chronic frontal sinusitis. 3. Bilateral chronic maxillary sinusitis. 4. Bilateral chronic ethmoid sinusitis.  POSTOPERATIVE DIAGNOSES: 1. Bilateral sinonasal polyps. 2. Bilateral chronic frontal sinusitis. 3. Bilateral chronic maxillary sinusitis. 4. Bilateral chronic ethmoid sinusitis.  PROCEDURE: 1. Endoscopic frontal recess, exploration with polyp removal. 2. Bilateral endoscopic total ethmoidectomy. 3. Bilateral endoscopic maxillary antrostomy with polyp removal. 4. Fusion, stereotactic navigation FUSION.  ANESTHESIA:  General endotracheal tube anesthesia.  COMPLICATIONS:  None.  ESTIMATED BLOOD LOSS:  900 mL.  INDICATION FOR PROCEDURE:  The patient is a 48 year old male with a history of chronic rhinosinusitis and sinonasal polyps.  He was previously treated with allergy medications, including topical steroids, systemic steroids, antihistamines, and decongestants without improvement in his symptoms.  He continues to have chronic nasal obstruction, anosmia, and facial pressure.  It should be noted that he previously underwent bilateral endoscopic sinus surgery more than 1 year ago in [place], [place].  However his symptoms are recurred a few months after his surgery.  When the patient was evaluated in the office, he was noted to have persistent large bilateral sinonasal polyps.  His CT scan showed opacification of both maxillary sinuses, ethmoid sinuses, and frontal sinuses.  Based on the above findings, the decision was made for the patient to undergo  the above-stated procedures.  The risks, benefits, alternatives, and details of the procedures were discussed with the patient.  Questions were invited and answered.  Informed consent was obtained.  DESCRIPTION:  The patient was taken to the operating room and placed supine on the operating table.  General endotracheal tube anesthesia was administered by the anesthesiologist.  The patient was positioned and prepped and draped in the standard fashion for sinus surgery.  The fusion navigation marker was placed.  The navigation system was noted to be functional throughout the case.  Pledgets soaked with Afrin were placed in both nasal cavities for vasoconstriction.  The pledgets were subsequently removed.  Attention was first focused on the left side.  Using a 0 degree scope, the left nasal cavity was examined.  A large amount of polypoid tissue was noted to occupy essentially the entire nasal cavity. Photodocumentation of the polyps was obtained.  A 1% lidocaine with 1:100,000 epinephrine was injected onto the nasal polyp and the lateral nasal wall.  The polypoid tissue was removed with a combination of Blakesley forceps, Tru-Cut forceps, and microdebrider.  It should be noted that the patient's middle turbinate was previously resected.  The polypoid tissue was noted to occupy the entire maxillary sinus, ethmoid cavities, and the frontal sinus.  Attention was first focused on the maxillary sinus.  The maxillary opening was widely enlarged with a combination  of backbiter, Tru-Cut forceps, and microdebrider.  Large amount polyps was removed from the maxillary sinus.  The anterior ethmoid air cell was then entered with a suction catheter.  Polypoid tissue was also removed from the anterior ethmoid cavity.  The partition between the anterior and posterior ethmoid cavities were taken down with the microdebrider.  A polypoid tissue was also removed from the posterior ethmoid cavity.  Attention  was then focused on the facial recess.  It was carefully enlarged using a combination of Giraffe forceps, Tru-Cut forceps, and microdebrider.  A polypoid tissue was removed from the frontal recess.  All sinus cavities were copiously irrigated.  At this time, the patient's inferior turbinate was also noted to be severely hypertrophied.  The inferior 1/3rd of the inferior turbinate was then cross clamped and resected with a pair of cross cutting scissors.  In order to decrease the chance of significant polyp regrowth, a PROPEL steroid en-coated stent was then placed in the ethmoid cavities.  Hemostasis was achieved with nasal pore dressing. The same procedure was then repeated on the right side without exceptions.  Exactly the same findings were also noted on the right side, with polypoid tissue filling the maxillary sinus, anterior and posterior ethmoid cavities, and the frontal recess.  Another PROPEL stent placed.  The care of the patient was turned over to the anesthesiologist.  The patient was awakened from anesthesia without difficulty.  He was extubated and transferred to the recovery room in good condition.  OPERATIVE FINDINGS:  Large polypoid tissue was noted to obstruct the bilateral nasal cavities, bilateral maxillary, ethmoid, and frontal sinuses.  SPECIMEN:  Bilateral sinus contents.  FOLLOWUP CARE:  The patient will be discharged home once he is awake and alert.  He will be placed on prescribed Percocet p.r.n. pain and amoxicillin 875 mg p.o. b.i.d. for 5 days.  The patient will follow up in my office in 4 days.     Leta Baptist, MD     ST/MEDQ  D:  07/24/2013  T:  07/24/2013  Job:  [id number]

## 2013-07-24 NOTE — Anesthesia Procedure Notes (Signed)
Procedure Name: Intubation Date/Time: 07/24/2013 8:24 AM Performed by: Lieutenant Diego Pre-anesthesia Checklist: Patient identified, Emergency Drugs available, Suction available and Patient being monitored Patient Re-evaluated:Patient Re-evaluated prior to inductionOxygen Delivery Method: Circle System Utilized Preoxygenation: Pre-oxygenation with 100% oxygen Intubation Type: IV induction Ventilation: Mask ventilation without difficulty Laryngoscope Size: Miller and 2 Grade View: Grade I Tube type: Oral Tube size: 8.0 mm Number of attempts: 1 Airway Equipment and Method: stylet and oral airway Placement Confirmation: ETT inserted through vocal cords under direct vision,  positive ETCO2 and breath sounds checked- equal and bilateral Secured at: 23 cm Tube secured with: Tape Dental Injury: Teeth and Oropharynx as per pre-operative assessment

## 2013-07-24 NOTE — Discharge Instructions (Addendum)
°Post Anesthesia Home Care Instructions ° °Activity: °Get plenty of rest for the remainder of the day. A responsible adult should stay with you for 24 hours following the procedure.  °For the next 24 hours, DO NOT: °-Drive a car °-Operate machinery °-Drink alcoholic beverages °-Take any medication unless instructed by your physician °-Make any legal decisions or sign important papers. ° °Meals: °Start with liquid foods such as gelatin or soup. Progress to regular foods as tolerated. Avoid greasy, spicy, heavy foods. If nausea and/or vomiting occur, drink only clear liquids until the nausea and/or vomiting subsides. Call your physician if vomiting continues. ° °Special Instructions/Symptoms: °Your throat may feel dry or sore from the anesthesia or the breathing tube placed in your throat during surgery. If this causes discomfort, gargle with warm salt water. The discomfort should disappear within 24 hours. ° °-------------------- ° °POSTOPERATIVE INSTRUCTIONS FOR PATIENTS HAVING NASAL OR SINUS OPERATIONS °ACTIVITY: Restrict activity at home for the first two days, resting as much as possible. Light activity is best. You may usually return to work within a week. You should refrain from nose blowing, strenuous activity, or heavy lifting greater than 20lbs for a total of three weeks after your operation.  If sneezing cannot be avoided, sneeze with your mouth open. °DISCOMFORT: You may experience a dull headache and pressure along with nasal congestion and discharge. These symptoms may be worse during the first week after the operation but may last as long as two to four weeks.  Please take Tylenol or the pain medication that has been prescribed for you. Do not take aspirin or aspirin containing medications since they may cause bleeding.  You may experience symptoms of post nasal drainage, nasal congestion, headaches and fatigue for two or three months after your operation.  °BLEEDING: You may have some blood tinged  nasal drainage for approximately two weeks after the operation.  The discharge will be worse for the first week.  Please call our office at [phone] or go to the nearest hospital emergency room if you experience any of the following: heavy, bright red blood from your nose or mouth that lasts longer than ten minutes or coughing up or vomiting bright red blood or blood clots. °GENERAL CONSIDERATIONS: °1. A gauze dressing will be placed on your upper lip to absorb any drainage after the operation. You may need to change this several times a day.  If you do not have very much drainage, you may remove the dressing.  Remember that you may gently wipe your nose with a tissue and sniff in, but DO NOT blow your nose. °2. Please keep all of your postoperative appointments.  Your final results after the operation will depend on proper follow-up.  The initial visit is usually four to seven days after the operation.  During this visit, the remaining nasal packing and internal septal splints will be removed.  Your nasal and sinus cavities will be cleaned.  During the second visit, your nasal and sinus cavities will be cleaned again. Have someone drive you to your first two postoperative appointments. We suggest that you take your prescribed pain medication about ½ hour prior to each of these two appointments.  °3. How you care for your nose after the operation will influence the results that you obtain.  You should follow all directions, take your medication as prescribed, and call our office [phone] with any problems or questions. °4. You may be more comfortable sleeping with your head elevated on two pillows. °5. Do   not take any medications that we have not prescribed or recommended. °WARNING SIGNS: if any of the following should occur, please call our office: °1. Bright red bleeding which lasts more than 10 minutes. °2. Persistent fever greater than 102F. °3. Persistent vomiting. °4. Severe and constant pain that is  not relieved by prescribed pain medication. °5. Trauma to the nose. °6. Rash or unusual side effects from any medicines. ° °

## 2013-07-24 NOTE — Transfer of Care (Signed)
Immediate Anesthesia Transfer of Care Note  Patient: Terry Gonzalez  Procedure(s) Performed: Procedure(s) with comments: ENDOSCOPIC SINUS SURGERY WITH  TOTAL ETHMOIDECTOMY, MAXILLARY ANTROSTOMY, AND FRONTAL RECESS EXPLORATION WITH FUSION NAVIGATION (Bilateral) - sinus  Patient Location: PACU  Anesthesia Type:General  Level of Consciousness: sedated  Airway & Oxygen Therapy: Patient Spontanous Breathing and Patient connected to face mask oxygen  Post-op Assessment: Report given to PACU RN and Post -op Vital signs reviewed and stable  Post vital signs: Reviewed and stable  Complications: No apparent anesthesia complications

## 2013-07-24 NOTE — Progress Notes (Signed)
Dr. Ola Spurr here in phase 2 discharge to examine pt. And assess O2 sats.  Discussed wwith pt and wife about need for sleep apnea machine use and they state understanding.  Wife feels confortable taking husband home at this point.  Dr. Ola Spurr ok with pt to be discharged.

## 2013-07-24 NOTE — H&P (Signed)
  H&P Update  Pt's original H&P dated 07/13/13 reviewed and placed in chart (to be scanned).  I personally examined the patient today.  No change in health. Proceed with bilateral endoscopic sinus surgery.

## 2013-07-24 NOTE — Brief Op Note (Signed)
07/24/2013  11:05 AM  PATIENT:  Terry Gonzalez  48 y.o. male  PRE-OPERATIVE DIAGNOSIS:   1) CHRONIC ETHMOID SINUSITIS 2) CHRONIC MAXILLARY SINUSITIS 3) CHRONIC FRONTAL SINUSITIS 4) SINONASAL POLYPS  POST-OPERATIVE DIAGNOSIS:  1) CHRONIC ETHMOID SINUSITIS 2) CHRONIC MAXILLARY SINUSITIS 3) CHRONIC FRONTAL SINUSITIS 4) SINONASAL POLYPS  PROCEDURE:  Procedure(s) with comments: 1) ENDOSCOPIC FRONTAL RECESS EXPLORATION WITH POLYP REMOVAL 2) ENDOSCOPIC TOTAL ETHMOIDECTOMY,  3) ENDOSCOPIC MAXILLARY ANTROSTOMY WITH POLYP REMOVAL 4) FUSION NAVIGATION   SURGEON:  Surgeon(s) and Role:    * Ascencion Dike, MD - Primary  PHYSICIAN ASSISTANT:   ASSISTANTS: none   ANESTHESIA:   general  EBL:  Total I/O In: 1600 [I.V.:1600] Out: 975 [Blood:975]  BLOOD ADMINISTERED:none  DRAINS: none   LOCAL MEDICATIONS USED:  LIDOCAINE   SPECIMEN:  Source of Specimen:  Bilateral sinus contents  DISPOSITION OF SPECIMEN:  PATHOLOGY  COUNTS:  YES  TOURNIQUET:  * No tourniquets in log *  DICTATION: .Other Dictation: Dictation Number [id number]  PLAN OF CARE: Discharge to home after PACU  PATIENT DISPOSITION:  PACU - hemodynamically stable.   Delay start of Pharmacological VTE agent (>24hrs) due to surgical blood loss or risk of bleeding: not applicable

## 2013-07-24 NOTE — Anesthesia Postprocedure Evaluation (Signed)
  Anesthesia Post-op Note  Patient: Corporate treasurer  Procedure(s) Performed: Procedure(s) with comments: ENDOSCOPIC SINUS SURGERY WITH  TOTAL ETHMOIDECTOMY, MAXILLARY ANTROSTOMY, AND FRONTAL RECESS EXPLORATION WITH FUSION NAVIGATION (Bilateral) - sinus  Patient Location: PACU  Anesthesia Type:General  Level of Consciousness: awake and alert   Airway and Oxygen Therapy: Patient Spontanous Breathing  Post-op Pain: mild  Post-op Assessment: Post-op Vital signs reviewed, Patient's Cardiovascular Status Stable and Respiratory Function Stable  Post-op Vital Signs: Reviewed  Filed Vitals:   07/24/13 1406  BP:   Pulse: 74  Temp: 36.7 C  Resp:     Complications: No apparent anesthesia complications

## 2013-07-25 ENCOUNTER — Encounter (HOSPITAL_BASED_OUTPATIENT_CLINIC_OR_DEPARTMENT_OTHER): Payer: Self-pay | Admitting: Otolaryngology

## 2013-07-27 ENCOUNTER — Ambulatory Visit (INDEPENDENT_AMBULATORY_CARE_PROVIDER_SITE_OTHER): Payer: BC Managed Care – PPO | Admitting: Otolaryngology

## 2013-07-27 DIAGNOSIS — J321 Chronic frontal sinusitis: Secondary | ICD-10-CM

## 2013-07-27 DIAGNOSIS — J330 Polyp of nasal cavity: Secondary | ICD-10-CM

## 2013-07-27 DIAGNOSIS — J322 Chronic ethmoidal sinusitis: Secondary | ICD-10-CM

## 2013-07-27 DIAGNOSIS — J320 Chronic maxillary sinusitis: Secondary | ICD-10-CM

## 2013-08-10 ENCOUNTER — Ambulatory Visit (INDEPENDENT_AMBULATORY_CARE_PROVIDER_SITE_OTHER): Payer: BC Managed Care – PPO | Admitting: Otolaryngology

## 2013-08-10 DIAGNOSIS — J322 Chronic ethmoidal sinusitis: Secondary | ICD-10-CM

## 2013-08-10 DIAGNOSIS — J321 Chronic frontal sinusitis: Secondary | ICD-10-CM

## 2013-08-10 DIAGNOSIS — J330 Polyp of nasal cavity: Secondary | ICD-10-CM

## 2013-08-10 DIAGNOSIS — J320 Chronic maxillary sinusitis: Secondary | ICD-10-CM

## 2013-09-14 ENCOUNTER — Ambulatory Visit (INDEPENDENT_AMBULATORY_CARE_PROVIDER_SITE_OTHER): Payer: BC Managed Care – PPO | Admitting: Otolaryngology

## 2013-09-14 DIAGNOSIS — J322 Chronic ethmoidal sinusitis: Secondary | ICD-10-CM

## 2013-09-14 DIAGNOSIS — J330 Polyp of nasal cavity: Secondary | ICD-10-CM

## 2013-09-14 DIAGNOSIS — J320 Chronic maxillary sinusitis: Secondary | ICD-10-CM

## 2013-12-14 ENCOUNTER — Ambulatory Visit (INDEPENDENT_AMBULATORY_CARE_PROVIDER_SITE_OTHER): Payer: BC Managed Care – PPO | Admitting: Otolaryngology

## 2013-12-14 DIAGNOSIS — J320 Chronic maxillary sinusitis: Secondary | ICD-10-CM

## 2013-12-14 DIAGNOSIS — J321 Chronic frontal sinusitis: Secondary | ICD-10-CM

## 2013-12-14 DIAGNOSIS — J322 Chronic ethmoidal sinusitis: Secondary | ICD-10-CM

## 2013-12-14 DIAGNOSIS — J330 Polyp of nasal cavity: Secondary | ICD-10-CM

## 2014-04-19 ENCOUNTER — Ambulatory Visit (INDEPENDENT_AMBULATORY_CARE_PROVIDER_SITE_OTHER): Payer: BC Managed Care – PPO | Admitting: Otolaryngology

## 2014-04-19 DIAGNOSIS — J330 Polyp of nasal cavity: Secondary | ICD-10-CM

## 2014-08-16 ENCOUNTER — Ambulatory Visit (INDEPENDENT_AMBULATORY_CARE_PROVIDER_SITE_OTHER): Payer: 59 | Admitting: Otolaryngology

## 2014-08-16 DIAGNOSIS — J310 Chronic rhinitis: Secondary | ICD-10-CM

## 2014-08-16 DIAGNOSIS — J330 Polyp of nasal cavity: Secondary | ICD-10-CM

## 2014-09-13 ENCOUNTER — Ambulatory Visit (INDEPENDENT_AMBULATORY_CARE_PROVIDER_SITE_OTHER): Payer: 59 | Admitting: Otolaryngology

## 2014-09-13 DIAGNOSIS — J330 Polyp of nasal cavity: Secondary | ICD-10-CM | POA: Diagnosis not present

## 2014-09-13 DIAGNOSIS — J320 Chronic maxillary sinusitis: Secondary | ICD-10-CM | POA: Diagnosis not present

## 2014-11-15 ENCOUNTER — Ambulatory Visit (INDEPENDENT_AMBULATORY_CARE_PROVIDER_SITE_OTHER): Payer: 59 | Admitting: Otolaryngology

## 2014-11-15 DIAGNOSIS — J310 Chronic rhinitis: Secondary | ICD-10-CM | POA: Diagnosis not present

## 2014-11-15 DIAGNOSIS — J330 Polyp of nasal cavity: Secondary | ICD-10-CM | POA: Diagnosis not present

## 2015-02-14 ENCOUNTER — Ambulatory Visit (INDEPENDENT_AMBULATORY_CARE_PROVIDER_SITE_OTHER): Payer: 59 | Admitting: Otolaryngology

## 2015-02-14 DIAGNOSIS — J320 Chronic maxillary sinusitis: Secondary | ICD-10-CM

## 2015-02-14 DIAGNOSIS — J322 Chronic ethmoidal sinusitis: Secondary | ICD-10-CM

## 2015-02-14 DIAGNOSIS — J330 Polyp of nasal cavity: Secondary | ICD-10-CM | POA: Diagnosis not present

## 2015-03-13 ENCOUNTER — Emergency Department (HOSPITAL_COMMUNITY)
Admission: EM | Admit: 2015-03-13 | Discharge: 2015-03-14 | Disposition: A | Discharge status: Home / Self Care | Payer: 59 | Attending: Emergency Medicine | Admitting: Emergency Medicine

## 2015-03-13 ENCOUNTER — Emergency Department (HOSPITAL_COMMUNITY): Payer: 59

## 2015-03-13 ENCOUNTER — Encounter (HOSPITAL_COMMUNITY): Payer: Self-pay | Admitting: Emergency Medicine

## 2015-03-13 DIAGNOSIS — R911 Solitary pulmonary nodule: Secondary | ICD-10-CM

## 2015-03-13 DIAGNOSIS — R1031 Right lower quadrant pain: Secondary | ICD-10-CM | POA: Diagnosis not present

## 2015-03-13 DIAGNOSIS — Z9981 Dependence on supplemental oxygen: Secondary | ICD-10-CM | POA: Diagnosis not present

## 2015-03-13 DIAGNOSIS — G4730 Sleep apnea, unspecified: Secondary | ICD-10-CM | POA: Insufficient documentation

## 2015-03-13 DIAGNOSIS — Z8709 Personal history of other diseases of the respiratory system: Secondary | ICD-10-CM | POA: Diagnosis not present

## 2015-03-13 DIAGNOSIS — Z720 Tobacco use: Secondary | ICD-10-CM | POA: Insufficient documentation

## 2015-03-13 LAB — CBC WITH DIFFERENTIAL/PLATELET
Basophils Absolute: 0 10*3/uL (ref 0.0–0.1)
Basophils Relative: 1 %
EOS ABS: 0.2 10*3/uL (ref 0.0–0.7)
EOS PCT: 5 %
HCT: 40.5 % (ref 39.0–52.0)
Hemoglobin: 14.1 g/dL (ref 13.0–17.0)
LYMPHS ABS: 2.1 10*3/uL (ref 0.7–4.0)
LYMPHS PCT: 51 %
MCH: 30.2 pg (ref 26.0–34.0)
MCHC: 34.8 g/dL (ref 30.0–36.0)
MCV: 86.7 fL (ref 78.0–100.0)
MONO ABS: 0.4 10*3/uL (ref 0.1–1.0)
MONOS PCT: 9 %
Neutro Abs: 1.4 10*3/uL — ABNORMAL LOW (ref 1.7–7.7)
Neutrophils Relative %: 34 %
PLATELETS: 173 10*3/uL (ref 150–400)
RBC: 4.67 MIL/uL (ref 4.22–5.81)
RDW: 13.1 % (ref 11.5–15.5)
WBC: 4 10*3/uL (ref 4.0–10.5)

## 2015-03-13 MED ORDER — SODIUM CHLORIDE 0.9 % IV SOLN
Freq: Once | INTRAVENOUS | Status: AC
  Administered 2015-03-13: via INTRAVENOUS

## 2015-03-13 MED ORDER — MORPHINE SULFATE (PF) 4 MG/ML IV SOLN
4.0000 mg | Freq: Once | INTRAVENOUS | Status: AC
  Administered 2015-03-13: 4 mg via INTRAVENOUS
  Filled 2015-03-13: qty 1

## 2015-03-13 NOTE — ED Notes (Signed)
LRQ pain onset today, pain comes and goes, no vomiting or diarrhea,

## 2015-03-13 NOTE — ED Provider Notes (Signed)
CSN: [id number]     Arrival date & time 03/13/15  2248 History  By signing my name below, I, Hansel Feinstein, attest that this documentation has been prepared under the direction and in the presence of Delora Fuel, MD. Electronically Signed: Hansel Feinstein, ED Scribe. 03/14/2015. 12:08 AM.    Chief Complaint  Patient presents with  . Abdominal Pain   The history is provided by the patient. No language interpreter was used.    HPI Comments: Aristide Waggle is a 49 y.o. male who presents to the Emergency Department complaining of moderate, 8/10 (now improved) RLQ abdominal pain onset this morning and worsened this evening. Pt states mild relief with rest. He states no exacerbating factors. Pt reports that he works in [employer]. He denies nausea, decreased appetite, diarrhea, constipation, fever, chills, dysuria, hematuria, difficulty urinating.    Past Medical History  Diagnosis Date  . Sinusitis   . Sleep apnea     uses a cpap   Past Surgical History  Procedure Laterality Date  . Nasal polyp surgery  2013    Dr. Redmond Pulling, ENT [facility])  . Mass excision  06/20/2012    Procedure: EXCISION MASS;  Surgeon: Scherry Ran, MD;  Location: [facility];  Service: General;  Laterality: Left;  Excision of Mass Left Lower Quadrant  . Sinus endo w/fusion Bilateral 07/24/2013    Procedure: ENDOSCOPIC SINUS SURGERY WITH  TOTAL ETHMOIDECTOMY, MAXILLARY ANTROSTOMY, AND FRONTAL RECESS EXPLORATION WITH FUSION NAVIGATION;  Surgeon: Ascencion Dike, MD;  Location: [facility];  Service: ENT;  Laterality: Bilateral;  sinus   No family history on file. Social History  Substance Use Topics  . Smoking status: Current Every Day Smoker -- 0.25 packs/day    Types: Cigarettes  . Smokeless tobacco: None  . Alcohol Use: No    Review of Systems  Constitutional: Negative for fever, chills and appetite change.  Gastrointestinal: Positive for abdominal pain. Negative for nausea, diarrhea and constipation.   Genitourinary: Negative for dysuria, hematuria and difficulty urinating.  All other systems reviewed and are negative.  Allergies  Review of patient's allergies indicates no known allergies.  Home Medications   Prior to Admission medications   Medication Sig Start Date End Date Taking? Authorizing Provider  oxyCODONE-acetaminophen (ROXICET) 5-325 MG per tablet Take 1 tablet by mouth every 4 (four) hours as needed for moderate pain or severe pain. 07/24/13   Leta Baptist, MD   BP 148/87 mmHg  Pulse 83  Temp(Src) 98.3 F (36.8 C) (Oral)  Resp 18  Ht 6' (1.829 m)  Wt 240 lb (108.863 kg)  BMI 32.54 kg/m2  SpO2 99% Physical Exam  Constitutional: He is oriented to person, place, and time. He appears well-developed and well-nourished.  HENT:  Head: Normocephalic and atraumatic.  Eyes: Conjunctivae and EOM are normal. Pupils are equal, round, and reactive to light.  Neck: Normal range of motion. Neck supple. No JVD present.  Cardiovascular: Normal rate, regular rhythm and normal heart sounds.   No murmur heard. Pulmonary/Chest: Effort normal and breath sounds normal. He has no wheezes. He has no rales. He exhibits no tenderness.  Abdominal: Soft. He exhibits no distension and no mass. There is tenderness. There is no rebound and no guarding.  Moderate right lower quadrant tenderness which is fairly well localized. No CVA tenderness.  Genitourinary:  abd mod RLQ ttp. No reb no guard. Bowel sounds decreased.   Musculoskeletal: Normal range of motion. He exhibits no edema.  Lymphadenopathy:  He has no cervical adenopathy.  Neurological: He is alert and oriented to person, place, and time. No cranial nerve deficit. He exhibits normal muscle tone. Coordination normal.  Skin: Skin is warm and dry. No rash noted.  Psychiatric: He has a normal mood and affect. His behavior is normal. Judgment and thought content normal.  Nursing note and vitals reviewed.  ED Course  Procedures (including  critical care time) DIAGNOSTIC STUDIES: Oxygen Saturation is 98% on RA, normal by my interpretation.    COORDINATION OF CARE: 11:40 PM Discussed treatment plan with pt at bedside and pt agreed to plan.   Labs Review Results for orders placed or performed during the hospital encounter of 03/13/15  Comprehensive metabolic panel  Result Value Ref Range   Sodium 139 135 - 145 mmol/L   Potassium 3.5 3.5 - 5.1 mmol/L   Chloride 106 101 - 111 mmol/L   CO2 25 22 - 32 mmol/L   Glucose, Bld 101 (H) 65 - 99 mg/dL   BUN 16 6 - 20 mg/dL   Creatinine, Ser 0.77 0.61 - 1.24 mg/dL   Calcium 8.6 (L) 8.9 - 10.3 mg/dL   Total Protein 7.0 6.5 - 8.1 g/dL   Albumin 4.1 3.5 - 5.0 g/dL   AST 27 15 - 41 U/L   ALT 60 17 - 63 U/L   Alkaline Phosphatase 50 38 - 126 U/L   Total Bilirubin 0.5 0.3 - 1.2 mg/dL   GFR calc non Af Amer >60 >60 mL/min   GFR calc Af Amer >60 >60 mL/min   Anion gap 8 5 - 15  Lipase, blood  Result Value Ref Range   Lipase 28 22 - 51 U/L  CBC with Differential  Result Value Ref Range   WBC 4.0 4.0 - 10.5 K/uL   RBC 4.67 4.22 - 5.81 MIL/uL   Hemoglobin 14.1 13.0 - 17.0 g/dL   HCT 40.5 39.0 - 52.0 %   MCV 86.7 78.0 - 100.0 fL   MCH 30.2 26.0 - 34.0 pg   MCHC 34.8 30.0 - 36.0 g/dL   RDW 13.1 11.5 - 15.5 %   Platelets 173 150 - 400 K/uL   Neutrophils Relative % 34 %   Neutro Abs 1.4 (L) 1.7 - 7.7 K/uL   Lymphocytes Relative 51 %   Lymphs Abs 2.1 0.7 - 4.0 K/uL   Monocytes Relative 9 %   Monocytes Absolute 0.4 0.1 - 1.0 K/uL   Eosinophils Relative 5 %   Eosinophils Absolute 0.2 0.0 - 0.7 K/uL   Basophils Relative 1 %   Basophils Absolute 0.0 0.0 - 0.1 K/uL  Urinalysis, Routine w reflex microscopic  Result Value Ref Range   Color, Urine YELLOW YELLOW   APPearance CLEAR CLEAR   Specific Gravity, Urine 1.025 1.005 - 1.030   pH 6.0 5.0 - 8.0   Glucose, UA NEGATIVE NEGATIVE mg/dL   Hgb urine dipstick TRACE (A) NEGATIVE   Bilirubin Urine NEGATIVE NEGATIVE   Ketones, ur  NEGATIVE NEGATIVE mg/dL   Protein, ur NEGATIVE NEGATIVE mg/dL   Urobilinogen, UA 0.2 0.0 - 1.0 mg/dL   Nitrite NEGATIVE NEGATIVE   Leukocytes, UA NEGATIVE NEGATIVE  Urine microscopic-add on  Result Value Ref Range   WBC, UA 3-6 <3 WBC/hpf   RBC / HPF 0-2 <3 RBC/hpf   Bacteria, UA FEW (A) RARE   Imaging Review Ct Abdomen Pelvis W Contrast  03/14/2015   CLINICAL DATA:  Right lower quadrant abdominal pain, onset this morning ten worsened  this evening.  EXAM: CT ABDOMEN AND PELVIS WITH CONTRAST  TECHNIQUE: Multidetector CT imaging of the abdomen and pelvis was performed using the standard protocol following bolus administration of intravenous contrast.  CONTRAST:  54mL OMNIPAQUE IOHEXOL 300 MG/ML SOLN, 140mL OMNIPAQUE IOHEXOL 300 MG/ML SOLN  COMPARISON:  None.  FINDINGS: Lower chest: Mild linear scarring in the left lateral costophrenic angle. 4 mm nodule in the lateral periphery of the right middle lobe.  Hepatobiliary: Diffuse fatty infiltration of the liver without focal lesion. Unremarkable gallbladder and bile ducts.  Pancreas: Normal  Spleen: Normal  Adrenals/Urinary Tract: 2 cm upper pole left renal cyst. Smaller upper pole right renal cyst. No urinary calculi. No hydronephrosis. Normal adrenals. Unremarkable urinary bladder.  Stomach/Bowel: There are normal appearances of the stomach, small bowel and colon. The appendix is normal.  Vascular/Lymphatic: The abdominal aorta is normal in caliber. There is no atherosclerotic calcification. There is no adenopathy in the abdomen or pelvis.  Reproductive: Unremarkable  Other: There is a fat containing left inguinal hernia. With the patient supine, the distal descending colon resides just inside the mouth of the hernia. There also is a fat containing umbilical hernia.  No acute inflammatory changes are evident in the abdomen or pelvis. There is no ascites.  Musculoskeletal: No significant musculoskeletal lesions.  IMPRESSION: 1. Normal appendix 2. Diffuse  fatty infiltration of the liver 3. Noncalcified 4 mm pulmonary nodule in the right middle lobe lateral periphery. If the patient is at high risk for bronchogenic carcinoma, follow-up chest CT at 1 year is recommended. If the patient is at low risk, no follow-up is needed. This recommendation follows the consensus statement: Guidelines for Management of Small Pulmonary Nodules Detected on CT Scans: A Statement from the [employer] as published in Radiology 2005; 237:395-400. 4. Fat containing left inguinal hernia. The distal descending colon extends to the mouth of the hernia. 5. Fat containing umbilical hernia   Electronically Signed   By: Andreas Newport M.D.   On: 03/14/2015 01:54   I have personally reviewed and evaluated these images and lab results as part of my medical decision-making.  MDM   Final diagnoses:  Right lower quadrant abdominal pain  Incidental lung nodule, > 32mm and < 29mm    Localized right lower quadrant tenderness. He will be sent for CT of abdomen and pelvis to rule out appendicitis.  CT shows no evidence of appendicitis and WBC is normal. Incidental finding of right middle lobe lung nodule. This is explained to patient including need for repeat CT scan in 12 months. He is discharged with prescription for tramadol.  I, Terrina Docter, personally performed the services described in this documentation. All medical record entries made by the scribe were at my direction and in my presence.  I have reviewed the chart and discharge instructions and agree that the record reflects my personal performance and is accurate and complete. Karnisha Lefebre.  03/14/2015. 12:59 AM.      Delora Fuel, MD 45/[id number]

## 2015-03-14 LAB — COMPREHENSIVE METABOLIC PANEL
ALBUMIN: 4.1 g/dL (ref 3.5–5.0)
ALK PHOS: 50 U/L (ref 38–126)
ALT: 60 U/L (ref 17–63)
AST: 27 U/L (ref 15–41)
Anion gap: 8 (ref 5–15)
BUN: 16 mg/dL (ref 6–20)
CALCIUM: 8.6 mg/dL — AB (ref 8.9–10.3)
CO2: 25 mmol/L (ref 22–32)
CREATININE: 0.77 mg/dL (ref 0.61–1.24)
Chloride: 106 mmol/L (ref 101–111)
GFR calc non Af Amer: >60 mL/min (ref >60)
GLUCOSE: 101 mg/dL — AB (ref 65–99)
Potassium: 3.5 mmol/L (ref 3.5–5.1)
SODIUM: 139 mmol/L (ref 135–145)
Total Bilirubin: 0.5 mg/dL (ref 0.3–1.2)
Total Protein: 7 g/dL (ref 6.5–8.1)

## 2015-03-14 LAB — URINALYSIS, ROUTINE W REFLEX MICROSCOPIC
Bilirubin Urine: NEGATIVE
GLUCOSE, UA: NEGATIVE mg/dL
Ketones, ur: NEGATIVE mg/dL
Leukocytes, UA: NEGATIVE
Nitrite: NEGATIVE
Protein, ur: NEGATIVE mg/dL
SPECIFIC GRAVITY, URINE: 1.025 (ref 1.005–1.030)
Urobilinogen, UA: 0.2 mg/dL (ref 0.0–1.0)
pH: 6 (ref 5.0–8.0)

## 2015-03-14 LAB — URINE MICROSCOPIC-ADD ON

## 2015-03-14 LAB — LIPASE, BLOOD: Lipase: 28 U/L (ref 22–51)

## 2015-03-14 MED ORDER — IOHEXOL 300 MG/ML  SOLN
100.0000 mL | Freq: Once | INTRAMUSCULAR | Status: DC | PRN
  Administered 2015-03-14: 100 mL via INTRAVENOUS
  Filled 2015-03-14: qty 100

## 2015-03-14 MED ORDER — TRAMADOL HCL 50 MG PO TABS: 50.0000 mg | ORAL_TABLET | Freq: Four times a day (QID) | ORAL | Status: DC | PRN

## 2015-03-14 MED ORDER — IOHEXOL 300 MG/ML  SOLN
50.0000 mL | Freq: Once | INTRAMUSCULAR | Status: AC | PRN
  Administered 2015-03-14: 50 mL via ORAL

## 2015-03-14 NOTE — Discharge Instructions (Signed)
Please return if your pain is getting worse. Your CAT scan did not show signs of appendicitis, but sometimes there are problems that CAT scan does not show.  Your CAT scan did show a nodule on the right lung. This needs to be evaluated with a repeat CAT scan in 1 year. Your primary care provider can order that.  Abdominal Pain, Adult Many things can cause abdominal pain. Usually, abdominal pain is not caused by a disease and will improve without treatment. It can often be observed and treated at home. Your health care provider will do a physical exam and possibly order blood tests and X-rays to help determine the seriousness of your pain. However, in many cases, more time must pass before a clear cause of the pain can be found. Before that point, your health care provider may not know if you need more testing or further treatment. HOME CARE INSTRUCTIONS Monitor your abdominal pain for any changes. The following actions may help to alleviate any discomfort you are experiencing:  Only take over-the-counter or prescription medicines as directed by your health care provider.  Do not take laxatives unless directed to do so by your health care provider.  Try a clear liquid diet (broth, tea, or water) as directed by your health care provider. Slowly move to a bland diet as tolerated. SEEK MEDICAL CARE IF:  You have unexplained abdominal pain.  You have abdominal pain associated with nausea or diarrhea.  You have pain when you urinate or have a bowel movement.  You experience abdominal pain that wakes you in the night.  You have abdominal pain that is worsened or improved by eating food.  You have abdominal pain that is worsened with eating fatty foods.  You have a fever. SEEK IMMEDIATE MEDICAL CARE IF:  Your pain does not go away within 2 hours.  You keep throwing up (vomiting).  Your pain is felt only in portions of the abdomen, such as the right side or the left lower portion of the  abdomen.  You pass bloody or black tarry stools. MAKE SURE YOU:  Understand these instructions.  Will watch your condition.  Will get help right away if you are not doing well or get worse.   This information is not intended to replace advice given to you by your health care provider. Make sure you discuss any questions you have with your health care provider.   Document Released: 03/04/2005 Document Revised: 02/13/2015 Document Reviewed: 02/01/2013 Elsevier Interactive Patient Education 2016 Elsevier Inc.  Tramadol tablets What is this medicine? TRAMADOL (TRA ma dole) is a pain reliever. It is used to treat moderate to severe pain in adults. This medicine may be used for other purposes; ask your health care provider or pharmacist if you have questions. What should I tell my health care provider before I take this medicine? They need to know if you have any of these conditions: -brain tumor -depression -drug abuse or addiction -head injury -if you frequently drink alcohol containing drinks -kidney disease or trouble passing urine -liver disease -lung disease, asthma, or breathing problems -seizures or epilepsy -suicidal thoughts, plans, or attempt; a previous suicide attempt by you or a family member -an unusual or allergic reaction to tramadol, codeine, other medicines, foods, dyes, or preservatives -pregnant or trying to get pregnant -breast-feeding How should I use this medicine? Take this medicine by mouth with a full glass of water. Follow the directions on the prescription label. If the medicine upsets your stomach,  take it with food or milk. Do not take more medicine than you are told to take. Talk to your pediatrician regarding the use of this medicine in children. Special care may be needed. Overdosage: If you think you have taken too much of this medicine contact a poison control center or emergency room at once. NOTE: This medicine is only for you. Do not share this  medicine with others. What if I miss a dose? If you miss a dose, take it as soon as you can. If it is almost time for your next dose, take only that dose. Do not take double or extra doses. What may interact with this medicine? Do not take this medicine with any of the following medications: -MAOIs like Carbex, Eldepryl, Marplan, Nardil, and Parnate This medicine may also interact with the following medications: -alcohol or medicines that contain alcohol -antihistamines -benzodiazepines -bupropion -carbamazepine or oxcarbazepine -clozapine -cyclobenzaprine -digoxin -furazolidone -linezolid -medicines for depression, anxiety, or psychotic disturbances -medicines for migraine headache like almotriptan, eletriptan, frovatriptan, naratriptan, rizatriptan, sumatriptan, zolmitriptan -medicines for pain like pentazocine, buprenorphine, butorphanol, meperidine, nalbuphine, and propoxyphene -medicines for sleep -muscle relaxants -naltrexone -phenobarbital -phenothiazines like perphenazine, thioridazine, chlorpromazine, mesoridazine, fluphenazine, prochlorperazine, promazine, and trifluoperazine -procarbazine -warfarin This list may not describe all possible interactions. Give your health care provider a list of all the medicines, herbs, non-prescription drugs, or dietary supplements you use. Also tell them if you smoke, drink alcohol, or use illegal drugs. Some items may interact with your medicine. What should I watch for while using this medicine? Tell your doctor or health care professional if your pain does not go away, if it gets worse, or if you have new or a different type of pain. You may develop tolerance to the medicine. Tolerance means that you will need a higher dose of the medicine for pain relief. Tolerance is normal and is expected if you take this medicine for a long time. Do not suddenly stop taking your medicine because you may develop a severe reaction. Your body becomes used  to the medicine. This does NOT mean you are addicted. Addiction is a behavior related to getting and using a drug for a non-medical reason. If you have pain, you have a medical reason to take pain medicine. Your doctor will tell you how much medicine to take. If your doctor wants you to stop the medicine, the dose will be slowly lowered over time to avoid any side effects. You may get drowsy or dizzy. Do not drive, use machinery, or do anything that needs mental alertness until you know how this medicine affects you. Do not stand or sit up quickly, especially if you are an older patient. This reduces the risk of dizzy or fainting spells. Alcohol can increase or decrease the effects of this medicine. Avoid alcoholic drinks. You may have constipation. Try to have a bowel movement at least every 2 to 3 days. If you do not have a bowel movement for 3 days, call your doctor or health care professional. Your mouth may get dry. Chewing sugarless gum or sucking hard candy, and drinking plenty of water may help. Contact your doctor if the problem does not go away or is severe. What side effects may I notice from receiving this medicine? Side effects that you should report to your doctor or health care professional as soon as possible: -allergic reactions like skin rash, itching or hives, swelling of the face, lips, or tongue -breathing difficulties, wheezing -confusion -itching -light headedness or fainting  spells -redness, blistering, peeling or loosening of the skin, including inside the mouth -seizures Side effects that usually do not require medical attention (report to your doctor or health care professional if they continue or are bothersome): -constipation -dizziness -drowsiness -headache -nausea, vomiting This list may not describe all possible side effects. Call your doctor for medical advice about side effects. You may report side effects to FDA at 1-800-FDA-1088. Where should I keep my  medicine? Keep out of the reach of children. This medicine may cause accidental overdose and death if it taken by other adults, children, or pets. Mix any unused medicine with a substance like cat litter or coffee grounds. Then throw the medicine away in a sealed container like a sealed bag or a coffee can with a lid. Do not use the medicine after the expiration date. Store at room temperature between 15 and 30 degrees C (59 and 86 degrees F). NOTE: This sheet is a summary. It may not cover all possible information. If you have questions about this medicine, talk to your doctor, pharmacist, or health care provider.    2016, Elsevier/Gold Standard. (2013-07-21 15:42:09)  Pulmonary Nodule A pulmonary nodule is a small, round growth of tissue in the lung. Pulmonary nodules can range in size from less than 1/5 inch (4 mm) to a little bigger than an inch (25 mm). Most pulmonary nodules are detected when imaging tests of the lung are being performed for a different problem. Pulmonary nodules are usually not cancerous (benign). However, some pulmonary nodules are cancerous (malignant). Follow-up treatment or testing is based on the size of the pulmonary nodule and your risk of getting lung cancer.  CAUSES Benign pulmonary nodules can be caused by various things. Some of the causes include:   Bacterial, fungal, or viral infections. This is usually an old infection that is no longer active, but it can sometimes be a current, active infection.  A benign mass of tissue.  Inflammation from conditions such as rheumatoid arthritis.   Abnormal blood vessels in the lungs. Malignant pulmonary nodules can result from lung cancer or from cancers that spread to the lung from other places in the body. SIGNS AND SYMPTOMS Pulmonary nodules usually do not cause symptoms. DIAGNOSIS Most often, pulmonary nodules are found incidentally when an X-ray or CT scan is performed to look for some other problem in the lung  area. To help determine whether a pulmonary nodule is benign or malignant, your health care provider will take a medical history and order a variety of tests. Tests done may include:   Blood tests.  A skin test called a tuberculin test. This test is used to determine if you have been exposed to the germ that causes tuberculosis.   Chest X-rays. If possible, a new X-ray may be compared with X-rays you have had in the past.   CT scan. This test shows smaller pulmonary nodules more clearly than an X-ray.   Positron emission tomography (PET) scan. In this test, a safe amount of a radioactive substance is injected into the bloodstream. Then, the scan takes a picture of the pulmonary nodule. The radioactive substance is eliminated from your body in your urine.   Biopsy. A tiny piece of the pulmonary nodule is removed so it can be checked under a microscope. TREATMENT  Pulmonary nodules that are benign normally do not require any treatment because they usually do not cause symptoms or breathing problems. Your health care provider may want to monitor the pulmonary nodule  through follow-up CT scans. The frequency of these CT scans will vary based on the size of the nodule and the risk factors for lung cancer. For example, CT scans will need to be done more frequently if the pulmonary nodule is larger and if you have a history of smoking and a family history of cancer. Further testing or biopsies may be done if any follow-up CT scan shows that the size of the pulmonary nodule has increased. HOME CARE INSTRUCTIONS  Only take over-the-counter or prescription medicines as directed by your health care provider.  Keep all follow-up appointments with your health care provider. SEEK MEDICAL CARE IF:  You have trouble breathing when you are active.   You feel sick or unusually tired.   You do not feel like eating.   You lose weight without trying to.   You develop chills or night sweats.  SEEK  IMMEDIATE MEDICAL CARE IF:  You cannot catch your breath, or you begin wheezing.   You cannot stop coughing.   You cough up blood.   You become dizzy or feel like you are going to pass out.   You have sudden chest pain.   You have a fever or persistent symptoms for more than 2-3 days.   You have a fever and your symptoms suddenly get worse. MAKE SURE YOU:  Understand these instructions.  Will watch your condition.  Will get help right away if you are not doing well or get worse.   This information is not intended to replace advice given to you by your health care provider. Make sure you discuss any questions you have with your health care provider.   Document Released: 03/22/2009 Document Revised: 01/25/2013 Document Reviewed: 11/14/2012 Elsevier Interactive Patient Education 2016 [employer].  Smoking Hazards Smoking cigarettes is extremely bad for your health. Tobacco smoke has over 200 known poisons in it. It contains the poisonous gases nitrogen oxide and carbon monoxide. There are over 60 chemicals in tobacco smoke that cause cancer. Some of the chemicals found in cigarette smoke include:   Cyanide.   Benzene.   Formaldehyde.   Methanol (wood alcohol).   Acetylene (fuel used in welding torches).   Ammonia.  Even smoking lightly shortens your life expectancy by several years. You can greatly reduce the risk of medical problems for you and your family by stopping now. Smoking is the most preventable cause of death and disease in our society. Within days of quitting smoking, your circulation improves, you decrease the risk of having a heart attack, and your lung capacity improves. There may be some increased phlegm in the first few days after quitting, and it may take months for your lungs to clear up completely. Quitting for 10 years reduces your risk of developing lung cancer to almost that of a nonsmoker.  WHAT ARE THE RISKS OF SMOKING? Cigarette smokers  have an increased risk of many serious medical problems, including:  Lung cancer.   Lung disease (such as pneumonia, bronchitis, and emphysema).   Heart attack and chest pain due to the heart not getting enough oxygen (angina).   Heart disease and peripheral blood vessel disease.   Hypertension.   Stroke.   Oral cancer (cancer of the lip, mouth, or voice box).   Bladder cancer.   Pancreatic cancer.   Cervical cancer.   Pregnancy complications, including premature birth.   Stillbirths and smaller newborn babies, birth defects, and genetic damage to sperm.   Early menopause.   Lower estrogen  level for women.   Infertility.   Facial wrinkles.   Blindness.   Increased risk of broken bones (fractures).   Senile dementia.   Stomach ulcers and internal bleeding.   Delayed wound healing and increased risk of complications during surgery. Because of secondhand smoke exposure, children of smokers have an increased risk of the following:   Sudden infant death syndrome (SIDS).   Respiratory infections.   Lung cancer.   Heart disease.   Ear infections.  WHY IS SMOKING ADDICTIVE? Nicotine is the chemical agent in tobacco that is capable of causing addiction or dependence. When you smoke and inhale, nicotine is absorbed rapidly into the bloodstream through your lungs. Both inhaled and noninhaled nicotine may be addictive.  WHAT ARE THE BENEFITS OF QUITTING?  There are many health benefits to quitting smoking. Some are:   The likelihood of developing cancer and heart disease decreases. Health improvements are seen almost immediately.   Blood pressure, pulse rate, and breathing patterns start returning to normal soon after quitting.   People who quit may see an improvement in their overall quality of life.  HOW DO YOU QUIT SMOKING? Smoking is an addiction with both physical and psychological effects, and longtime habits can be hard to  change. Your health care provider can recommend:  Programs and community resources, which may include group support, education, or therapy.  Replacement products, such as patches, gum, and nasal sprays. Use these products only as directed. Do not replace cigarette smoking with electronic cigarettes (commonly called e-cigarettes). The safety of e-cigarettes is unknown, and some may contain harmful chemicals. FOR MORE INFORMATION  American Lung Association: www.lung.org  American Cancer Society: www.cancer.org   This information is not intended to replace advice given to you by your health care provider. Make sure you discuss any questions you have with your health care provider.   Document Released: 07/02/2004 Document Revised: 03/15/2013 Document Reviewed: 11/14/2012 Elsevier Interactive Patient Education [employer].

## 2015-05-16 ENCOUNTER — Ambulatory Visit (INDEPENDENT_AMBULATORY_CARE_PROVIDER_SITE_OTHER): Payer: 59 | Admitting: Otolaryngology

## 2015-05-16 DIAGNOSIS — J330 Polyp of nasal cavity: Secondary | ICD-10-CM

## 2015-05-16 DIAGNOSIS — J310 Chronic rhinitis: Secondary | ICD-10-CM

## 2015-06-24 ENCOUNTER — Ambulatory Visit (INDEPENDENT_AMBULATORY_CARE_PROVIDER_SITE_OTHER): Payer: BLUE CROSS/BLUE SHIELD | Admitting: Orthopedic Surgery

## 2015-06-24 ENCOUNTER — Ambulatory Visit (INDEPENDENT_AMBULATORY_CARE_PROVIDER_SITE_OTHER): Payer: BLUE CROSS/BLUE SHIELD

## 2015-06-24 VITALS — BP 105/73 | Ht 72.0 in | Wt 254.2 lb

## 2015-06-24 DIAGNOSIS — R208 Other disturbances of skin sensation: Secondary | ICD-10-CM | POA: Diagnosis not present

## 2015-06-24 DIAGNOSIS — R200 Anesthesia of skin: Secondary | ICD-10-CM

## 2015-06-24 NOTE — Progress Notes (Signed)
Patient ID: Terry Gonzalez, male   DOB: [birth year], 50 y.o.   MRN: [id number]  Chief Complaint  Patient presents with  . Leg Problem    right upper outer leg numbness and burning    HPI Osamah Vandijk is a 50 y.o. male.  Presents with a history of numbness over the lateral aspect of his upper to mid thigh.  Injury no numbness tingling yes timing constant significant 8 out of 10 but he says really it is not hurting no previous medication no functional deficits urination fine bowel bladder 5  Review of Systems Review of Systems  Gastrointestinal: Negative.   Genitourinary: Negative.   Neurological: Positive for numbness. Negative for weakness.    Past Medical History  Diagnosis Date  . Sinusitis   . Sleep apnea     uses a cpap    Past Surgical History  Procedure Laterality Date  . Nasal polyp surgery  2013    Dr. Redmond Pulling, ENT [facility])  . Mass excision  06/20/2012    Procedure: EXCISION MASS;  Surgeon: Scherry Ran, MD;  Location: [facility];  Service: General;  Laterality: Left;  Excision of Mass Left Lower Quadrant  . Sinus endo w/fusion Bilateral 07/24/2013    Procedure: ENDOSCOPIC SINUS SURGERY WITH  TOTAL ETHMOIDECTOMY, MAXILLARY ANTROSTOMY, AND FRONTAL RECESS EXPLORATION WITH FUSION NAVIGATION;  Surgeon: Ascencion Dike, MD;  Location: [facility];  Service: ENT;  Laterality: Bilateral;  sinus      Social History Social History  Substance Use Topics  . Smoking status: Current Every Day Smoker -- 0.25 packs/day    Types: Cigarettes  . Smokeless tobacco: Not on file  . Alcohol Use: No    No Known Allergies  No current outpatient prescriptions on file.   No current facility-administered medications for this visit.       Physical Exam Physical Exam Blood pressure 105/73, height 6' (1.829 m), weight 254 lb 3.2 oz (115.304 kg). Appearance, there are no abnormalities in terms of appearance the patient was well-developed and well-nourished. The grooming and  hygiene were normal.  Mental status orientation, there was normal alertness and orientation Mood pleasant Ambulatory status normal with no assistive devices  Examination of the lower extremities Inspection normal appearance no mass palpable right or left thigh Range of motion hips normal Tests for stability hips stable Motor strength  tone and strength normal bilaterally Skin warm dry and intact without laceration or ulceration or erythema Neurologic examination normal on the left abnormal on the right and a small area almost as if he has one of the neurofibromas  Vascular examination normal pulses with warm extremity and normal capillary refill   Data Reviewed Lumbar spine films shows fairly normal disc space is some straightening of the lumbar spine  Assessment  Neurofibroma   Plan  Observation

## 2015-08-15 ENCOUNTER — Ambulatory Visit (INDEPENDENT_AMBULATORY_CARE_PROVIDER_SITE_OTHER): Payer: BLUE CROSS/BLUE SHIELD | Admitting: Otolaryngology

## 2015-08-15 DIAGNOSIS — J310 Chronic rhinitis: Secondary | ICD-10-CM

## 2015-08-15 DIAGNOSIS — J330 Polyp of nasal cavity: Secondary | ICD-10-CM | POA: Diagnosis not present

## 2015-12-12 ENCOUNTER — Ambulatory Visit (INDEPENDENT_AMBULATORY_CARE_PROVIDER_SITE_OTHER): Payer: BLUE CROSS/BLUE SHIELD | Admitting: Otolaryngology

## 2015-12-12 DIAGNOSIS — J330 Polyp of nasal cavity: Secondary | ICD-10-CM

## 2015-12-12 DIAGNOSIS — J320 Chronic maxillary sinusitis: Secondary | ICD-10-CM | POA: Diagnosis not present

## 2015-12-12 DIAGNOSIS — J322 Chronic ethmoidal sinusitis: Secondary | ICD-10-CM

## 2016-03-03 ENCOUNTER — Other Ambulatory Visit (HOSPITAL_COMMUNITY): Payer: Self-pay | Admitting: Internal Medicine

## 2016-03-03 DIAGNOSIS — Z87898 Personal history of other specified conditions: Secondary | ICD-10-CM

## 2016-03-09 ENCOUNTER — Ambulatory Visit (HOSPITAL_COMMUNITY): Payer: BLUE CROSS/BLUE SHIELD

## 2016-03-11 ENCOUNTER — Ambulatory Visit (HOSPITAL_COMMUNITY): Payer: BLUE CROSS/BLUE SHIELD

## 2016-03-18 ENCOUNTER — Ambulatory Visit (HOSPITAL_COMMUNITY): Payer: BLUE CROSS/BLUE SHIELD

## 2016-03-23 ENCOUNTER — Ambulatory Visit (HOSPITAL_COMMUNITY)
Admission: RE | Admit: 2016-03-23 | Discharge: 2016-03-23 | Disposition: A | Discharge status: Home / Self Care | Payer: BLUE CROSS/BLUE SHIELD | Source: Ambulatory Visit | Attending: Internal Medicine | Admitting: Internal Medicine

## 2016-03-23 DIAGNOSIS — R918 Other nonspecific abnormal finding of lung field: Secondary | ICD-10-CM | POA: Diagnosis not present

## 2016-03-23 DIAGNOSIS — R911 Solitary pulmonary nodule: Secondary | ICD-10-CM | POA: Diagnosis present

## 2016-03-23 DIAGNOSIS — Z87898 Personal history of other specified conditions: Secondary | ICD-10-CM

## 2016-04-07 ENCOUNTER — Telehealth: Payer: Self-pay

## 2016-04-07 NOTE — Telephone Encounter (Signed)
[phone] PATIENT RECEIVED LETTER TO SCHEDULE TCS

## 2016-04-17 ENCOUNTER — Telehealth: Payer: Self-pay

## 2016-04-17 NOTE — Telephone Encounter (Signed)
Triaged today.  

## 2016-04-20 NOTE — Telephone Encounter (Signed)
Does not appear to be on any medications. Has sleep apnea and uses CPAP. He'll need to bring his settings with him. Ok to schedule otherwise.

## 2016-04-20 NOTE — Telephone Encounter (Signed)
Gastroenterology Pre-Procedure Review  Request Date: 04/17/2016 Requesting Physician: Dr. Legrand Rams  PATIENT REVIEW QUESTIONS: The patient responded to the following health history questions as indicated:    1. Diabetes Melitis: no 2. Joint replacements in the past 12 months: no 3. Major health problems in the past 3 months: no 4. Has an artificial valve or MVP: no 5. Has a defibrillator: no 6. Has been advised in past to take antibiotics in advance of a procedure like teeth cleaning: no 7. Family history of colon cancer: no  8. Alcohol Use: no  9. History of sleep apnea: YES   HAS C PAP 10. History of coronary artery or other vascular stents placed within the last 12 months: no    MEDICATIONS & ALLERGIES:    Patient reports the following regarding taking any blood thinners:   Plavix? no Aspirin? no Coumadin? no Brilinta? no Xarelto? no Eliquis? no Pradaxa? no Savaysa? no Effient? no  Patient confirms/reports the following medications:  No current outpatient prescriptions on file.   No current facility-administered medications for this visit.     Patient confirms/reports the following allergies:  No Known Allergies  No orders of the defined types were placed in this encounter.   AUTHORIZATION INFORMATION Primary Insurance:  ID #:  Group #:  Pre-Cert / Auth required: Pre-Cert / Auth #:   Secondary Insurance:  ID #: Group #:  Pre-Cert / Auth required:  Pre-Cert / Auth #:   SCHEDULE INFORMATION: Procedure has been scheduled as follows:  Date:                 Time:  Location:   This Gastroenterology Pre-Precedure Review Form is being routed to the following provider(s): R. Garfield Cornea, MD

## 2016-04-27 ENCOUNTER — Other Ambulatory Visit: Payer: Self-pay

## 2016-04-27 DIAGNOSIS — Z1211 Encounter for screening for malignant neoplasm of colon: Secondary | ICD-10-CM

## 2016-04-27 MED ORDER — PEG 3350-KCL-NA BICARB-NACL 420 G PO SOLR: 4000.0000 mL | ORAL | 0 refills | Status: DC

## 2016-04-27 NOTE — Telephone Encounter (Signed)
PT has been scheduled for 05/20/2016 at 12:45 pm with Dr. Gala Romney and he is aware to take his readings for his C-pap to the hospital with him.

## 2016-04-27 NOTE — Telephone Encounter (Signed)
Rx sent to the pharmacy and instructions mailed to the pt.  

## 2016-05-04 ENCOUNTER — Ambulatory Visit (INDEPENDENT_AMBULATORY_CARE_PROVIDER_SITE_OTHER): Payer: BLUE CROSS/BLUE SHIELD | Admitting: Otolaryngology

## 2016-05-04 DIAGNOSIS — J320 Chronic maxillary sinusitis: Secondary | ICD-10-CM

## 2016-05-04 DIAGNOSIS — J321 Chronic frontal sinusitis: Secondary | ICD-10-CM | POA: Diagnosis not present

## 2016-05-04 DIAGNOSIS — J330 Polyp of nasal cavity: Secondary | ICD-10-CM | POA: Diagnosis not present

## 2016-05-20 ENCOUNTER — Ambulatory Visit (HOSPITAL_COMMUNITY)
Admission: RE | Admit: 2016-05-20 | Discharge: 2016-05-20 | Disposition: A | Discharge status: Home / Self Care | Payer: BLUE CROSS/BLUE SHIELD | Source: Ambulatory Visit | Attending: Internal Medicine | Admitting: Internal Medicine

## 2016-05-20 ENCOUNTER — Encounter (HOSPITAL_COMMUNITY): Payer: Self-pay | Admitting: *Deleted

## 2016-05-20 ENCOUNTER — Encounter (HOSPITAL_COMMUNITY)
Admission: RE | Disposition: A | Discharge status: Home / Self Care | Payer: Self-pay | Source: Ambulatory Visit | Attending: Internal Medicine

## 2016-05-20 DIAGNOSIS — Z79899 Other long term (current) drug therapy: Secondary | ICD-10-CM | POA: Insufficient documentation

## 2016-05-20 DIAGNOSIS — Z1211 Encounter for screening for malignant neoplasm of colon: Secondary | ICD-10-CM | POA: Diagnosis not present

## 2016-05-20 DIAGNOSIS — F17210 Nicotine dependence, cigarettes, uncomplicated: Secondary | ICD-10-CM | POA: Insufficient documentation

## 2016-05-20 DIAGNOSIS — D120 Benign neoplasm of cecum: Secondary | ICD-10-CM | POA: Diagnosis not present

## 2016-05-20 DIAGNOSIS — Z1212 Encounter for screening for malignant neoplasm of rectum: Secondary | ICD-10-CM

## 2016-05-20 DIAGNOSIS — G4730 Sleep apnea, unspecified: Secondary | ICD-10-CM | POA: Insufficient documentation

## 2016-05-20 DIAGNOSIS — D123 Benign neoplasm of transverse colon: Secondary | ICD-10-CM | POA: Diagnosis not present

## 2016-05-20 HISTORY — DX: Other seasonal allergic rhinitis: J30.2

## 2016-05-20 HISTORY — PX: COLONOSCOPY: SHX5424

## 2016-05-20 SURGERY — COLONOSCOPY
Anesthesia: Moderate Sedation

## 2016-05-20 MED ORDER — ONDANSETRON HCL 4 MG/2ML IJ SOLN
INTRAMUSCULAR | Status: AC
  Filled 2016-05-20: qty 2

## 2016-05-20 MED ORDER — ONDANSETRON HCL 4 MG/2ML IJ SOLN
INTRAMUSCULAR | Status: DC | PRN
  Administered 2016-05-20: 4 mg via INTRAVENOUS

## 2016-05-20 MED ORDER — MEPERIDINE HCL 100 MG/ML IJ SOLN
INTRAMUSCULAR | Status: DC | PRN
  Administered 2016-05-20: 25 mg
  Administered 2016-05-20: 50 mg

## 2016-05-20 MED ORDER — SODIUM CHLORIDE 0.9 % IV SOLN
INTRAVENOUS | Status: DC
  Administered 2016-05-20: 12:00:00 via INTRAVENOUS

## 2016-05-20 MED ORDER — MIDAZOLAM HCL 5 MG/5ML IJ SOLN
INTRAMUSCULAR | Status: DC | PRN
  Administered 2016-05-20: 1 mg via INTRAVENOUS
  Administered 2016-05-20: 2 mg via INTRAVENOUS
  Administered 2016-05-20: 1 mg via INTRAVENOUS

## 2016-05-20 MED ORDER — MIDAZOLAM HCL 5 MG/5ML IJ SOLN
INTRAMUSCULAR | Status: AC
  Filled 2016-05-20: qty 10

## 2016-05-20 MED ORDER — MEPERIDINE HCL 100 MG/ML IJ SOLN
INTRAMUSCULAR | Status: AC
  Filled 2016-05-20: qty 2

## 2016-05-20 NOTE — H&P (Signed)
@LOGO @   Primary Care Physician:  Rosita Fire, MD Primary Gastroenterologist:  Dr. Gala Romney  Pre-Procedure History & Physical: HPI:  Terry Gonzalez is a 50 y.o. male here for first ever screening colonoscopy. No bowel symptoms. No family history of colon cancer.  Past Medical History:  Diagnosis Date  . Seasonal allergies   . Sinusitis   . Sleep apnea    uses a cpap    Past Surgical History:  Procedure Laterality Date  . MASS EXCISION  06/20/2012   Procedure: EXCISION MASS;  Surgeon: Scherry Ran, MD;  Location: [facility];  Service: General;  Laterality: Left;  Excision of Mass Left Lower Quadrant  . NASAL POLYP SURGERY  2013   Dr. Redmond Pulling, ENT [facility])  . SINUS ENDO W/FUSION Bilateral 07/24/2013   Procedure: ENDOSCOPIC SINUS SURGERY WITH  TOTAL ETHMOIDECTOMY, MAXILLARY ANTROSTOMY, AND FRONTAL RECESS EXPLORATION WITH FUSION NAVIGATION;  Surgeon: Ascencion Dike, MD;  Location: [place];  Service: ENT;  Laterality: Bilateral;  sinus    Prior to Admission medications   Medication Sig Start Date End Date Taking? Authorizing Provider  fluticasone (FLONASE) 50 MCG/ACT nasal spray Place 1 spray into both nostrils daily as needed for allergies. 03/12/16  Yes Historical Provider, MD  loratadine (CLARITIN) 10 MG tablet Take 10 mg by mouth daily as needed for allergies. 03/02/16  Yes Historical Provider, MD  polyethylene glycol-electrolytes (TRILYTE) 420 g solution Take 4,000 mLs by mouth as directed. 04/27/16  Yes Daneil Dolin, MD    Allergies as of 04/27/2016  . (No Known Allergies)    Family History  Problem Relation Age of Onset  . Colon cancer Neg Hx     Social History   Social History  . Marital status: Married    Spouse name: N/A  . Number of children: N/A  . Years of education: N/A   Occupational History  . Not on file.   Social History Main Topics  . Smoking status: Current Every Day Smoker    Packs/day: 0.25    Years: 25.00    Types: Cigarettes  .  Smokeless tobacco: Never Used  . Alcohol use No  . Drug use: No  . Sexual activity: Not on file   Other Topics Concern  . Not on file   Social History Narrative  . No narrative on file    Review of Systems: See HPI, otherwise negative ROS  Physical Exam: BP 126/77   Pulse (!) 57   Temp 98 F (36.7 C) (Oral)   Resp 15   Ht 6' (1.829 m)   Wt 236 lb (107 kg)   SpO2 96%   BMI 32.01 kg/m  General:   Alert,  Well-developed, well-nourished, pleasant and cooperative in NAD Skin:  Intact without significant lesions or rashes. Neck:  Supple; no masses or thyromegaly. No significant cervical adenopathy. Lungs:  Clear throughout to auscultation.   No wheezes, crackles, or rhonchi. No acute distress. Heart:  Regular rate and rhythm; no murmurs, clicks, rubs,  or gallops. Abdomen: Non-distended, normal bowel sounds.  Soft and nontender without appreciable mass or hepatosplenomegaly.  Pulses:  Normal pulses noted. Extremities:  Without clubbing or edema.  Impression/ Recommendations:  Pleasant 50 year old gentleman presents for his first ever average risk screening colonoscopy.  The risks, benefits, limitations, alternatives and imponderables have been reviewed with the patient. Questions have been answered. All parties are agreeable.      Notice: This dictation was prepared with Dragon dictation along with smaller phrase technology.  Any transcriptional errors that result from this process are unintentional and may not be corrected upon review.

## 2016-05-20 NOTE — Op Note (Signed)
[facility] Patient Name: Terry Gonzalez Procedure Date: 05/20/2016 12:37 PM MRN: [id number] Date of Birth: 07/12/1965 Attending MD: Norvel Richards , MD CSN: [id number] Age: 50 Admit Type: Outpatient Procedure:                Colonoscopy with multiple snare polypectomies Indications:              Screening for colorectal malignant neoplasm Providers:                Norvel Richards, MD, Charlyne Petrin RN, RN,                            [employer]cell [employer]. [place], [employer] officer Referring MD:              Medicines:                Midazolam 4 mg IV, Meperidine 75 mg IV, Ondansetron                            4 mg IV Complications:            No immediate complications. Estimated Blood Loss:     Estimated blood loss was minimal. Procedure:                Pre-Anesthesia Assessment:                           - Prior to the procedure, a History and Physical                            was performed, and patient medications and                            allergies were reviewed. The patient's tolerance of                            previous anesthesia was also reviewed. The risks                            and benefits of the procedure and the sedation                            options and risks were discussed with the patient.                            All questions were answered, and informed consent                            was obtained. Prior Anticoagulants: The patient has                            taken no previous anticoagulant or antiplatelet                            agents. ASA Grade Assessment: II - A patient with  mild systemic disease. After reviewing the risks                            and benefits, the patient was deemed in                            satisfactory condition to undergo the procedure.                           After obtaining informed consent, the colonoscope                            was passed under direct vision. Throughout  the                            procedure, the patient's blood pressure, pulse, and                            oxygen saturations were monitored continuously. The                            [id number]) scope was introduced through                            the anus and advanced to the the cecum, identified                            by appendiceal orifice and ileocecal valve. The                            colonoscopy was performed without difficulty. The                            patient tolerated the procedure well. The quality                            of the bowel preparation was adequate. The entire                            colon was well visualized. The ileocecal valve,                            appendiceal orifice, and rectum were photographed. Scope In: 12:52:55 PM Scope Out: 1:11:58 PM Scope Withdrawal Time: 0 hours 14 minutes 53 seconds  Total Procedure Duration: 0 hours 19 minutes 3 seconds  Findings:      The perianal and digital rectal examinations were normal.      Four semi-pedunculated polyps were found in the transverse colon and       cecum. The polyps were 4 to 5 mm in size. These polyps were removed with       a cold snare. Resection and retrieval were complete. Estimated blood       loss was minimal.      The exam was otherwise without abnormality on direct and retroflexion  views.      The exam was otherwise without abnormality. Impression:               - Four 4 to 5 mm polyps in the transverse colon and                            in the cecum, removed with a cold snare. Resected                            and retrieved. Moderate Sedation:      Moderate (conscious) sedation was administered by the endoscopy nurse       and supervised by the endoscopist. The following parameters were       monitored: oxygen saturation, heart rate, blood pressure, respiratory       rate, EKG, adequacy of pulmonary ventilation, and response to care.       Total  physician intraservice time was 25 minutes. Recommendation:           - Patient has a contact number available for                            emergencies. The signs and symptoms of potential                            delayed complications were discussed with the                            patient. Return to normal activities tomorrow.                            Written discharge instructions were provided to the                            patient.                           - Resume previous diet.                           - Continue present medications.                           - Repeat colonoscopy date to be determined after                            pending pathology results are reviewed for                            surveillance based on pathology results.                           - Return to GI office (date not yet determined). Procedure Code(s):        --- Professional ---                           [phone], Colonoscopy, flexible; with removal of  tumor(s), polyp(s), or other lesion(s) by snare                            technique                           99152, Moderate sedation services provided by the                            same physician or other qualified health care                            professional performing the diagnostic or                            therapeutic service that the sedation supports,                            requiring the presence of an independent trained                            observer to assist in the monitoring of the                            patient's level of consciousness and physiological                            status; initial 15 minutes of intraservice time,                            patient age 72 years or older                           [phone], Moderate sedation services; each additional                            15 minutes intraservice time Diagnosis Code(s):        --- Professional ---                            Z12.11, Encounter for screening for malignant                            neoplasm of colon                           D12.3, Benign neoplasm of transverse colon (hepatic                            flexure or splenic flexure)                           D12.0, Benign neoplasm of cecum CPT copyright 2016 American Medical Association. All rights reserved. The codes documented in this report are preliminary and upon coder review may  be revised to meet current compliance requirements. Herbie [place]  Hilton Cork, MD Norvel Richards, MD 05/20/2016 1:19:40 PM This report has been signed electronically. Number of Addenda: 0

## 2016-05-20 NOTE — Discharge Instructions (Addendum)
Colonoscopy Discharge Instructions  Read the instructions outlined below and refer to this sheet in the next few weeks. These discharge instructions provide you with general information on caring for yourself after you leave the hospital. Your doctor may also give you specific instructions. While your treatment has been planned according to the most current medical practices available, unavoidable complications occasionally occur. If you have any problems or questions after discharge, call Dr. Gala Romney at [phone]. ACTIVITY  You may resume your regular activity, but move at a slower pace for the next 24 hours.   Take frequent rest periods for the next 24 hours.   Walking will help get rid of the air and reduce the bloated feeling in your belly (abdomen).   No driving for 24 hours (because of the medicine (anesthesia) used during the test).    Do not sign any important legal documents or operate any machinery for 24 hours (because of the anesthesia used during the test).  NUTRITION  Drink plenty of fluids.   You may resume your normal diet as instructed by your doctor.   Begin with a light meal and progress to your normal diet. Heavy or fried foods are harder to digest and may make you feel sick to your stomach (nauseated).   Avoid alcoholic beverages for 24 hours or as instructed.  MEDICATIONS  You may resume your normal medications unless your doctor tells you otherwise.  WHAT YOU CAN EXPECT TODAY  Some feelings of bloating in the abdomen.   Passage of more gas than usual.   Spotting of blood in your stool or on the toilet paper.  IF YOU HAD POLYPS REMOVED DURING THE COLONOSCOPY:  No aspirin products for 7 days or as instructed.   No alcohol for 7 days or as instructed.   Eat a soft diet for the next 24 hours.  FINDING OUT THE RESULTS OF YOUR TEST Not all test results are available during your visit. If your test results are not back during the visit, make an appointment  with your caregiver to find out the results. Do not assume everything is normal if you have not heard from your caregiver or the medical facility. It is important for you to follow up on all of your test results.  SEEK IMMEDIATE MEDICAL ATTENTION IF:  You have more than a spotting of blood in your stool.   Your belly is swollen (abdominal distention).   You are nauseated or vomiting.   You have a temperature over 101.   You have abdominal pain or discomfort that is severe or gets worse throughout the day.     Colon polyp information provided  Further recommendations to follow pending review of pathology report   Colon Polyps Introduction Polyps are tissue growths inside the body. Polyps can grow in many places, including the large intestine (colon). A polyp may be a round bump or a mushroom-shaped growth. You could have one polyp or several. Most colon polyps are noncancerous (benign). However, some colon polyps can become cancerous over time. What are the causes? The exact cause of colon polyps is not known. What increases the risk? This condition is more likely to develop in people who:  Have a family history of colon cancer or colon polyps.  Are older than 74 or older than 45 if they are African American.  Have inflammatory bowel disease, such as ulcerative colitis or Crohn disease.  Are overweight.  Smoke cigarettes.  Do not get enough exercise.  Drink too  much alcohol.  Eat a diet that is:  High in fat and red meat.  Low in fiber.  Had childhood cancer that was treated with abdominal radiation. What are the signs or symptoms? Most polyps do not cause symptoms. If you have symptoms, they may include:  Blood coming from your rectum when having a bowel movement.  Blood in your stool.The stool may look dark red or black.  A change in bowel habits, such as constipation or diarrhea. How is this diagnosed? This condition is diagnosed with a colonoscopy. This  is a procedure that uses a lighted, flexible scope to look at the inside of your colon. How is this treated? Treatment for this condition involves removing any polyps that are found. Those polyps will then be tested for cancer. If cancer is found, your health care provider will talk to you about options for colon cancer treatment. Follow these instructions at home: Diet  Eat plenty of fiber, such as fruits, vegetables, and whole grains.  Eat foods that are high in calcium and vitamin D, such as milk, cheese, yogurt, eggs, liver, fish, and broccoli.  Limit foods high in fat, red meats, and processed meats, such as hot dogs, sausage, bacon, and lunch meats.  Maintain a healthy weight, or lose weight if recommended by your health care provider. General instructions  Do not smoke cigarettes.  Do not drink alcohol excessively.  Keep all follow-up visits as told by your health care provider. This is important. This includes keeping regularly scheduled colonoscopies. Talk to your health care provider about when you need a colonoscopy.  Exercise every day or as told by your health care provider. Contact a health care provider if:  You have new or worsening bleeding during a bowel movement.  You have new or increased blood in your stool.  You have a change in bowel habits.  You unexpectedly lose weight. This information is not intended to replace advice given to you by your health care provider. Make sure you discuss any questions you have with your health care provider. Document Released: 02/19/2004 Document Revised: 10/31/2015 Document Reviewed: 04/15/2015  2017 Elsevier

## 2016-05-22 ENCOUNTER — Encounter: Payer: Self-pay | Admitting: Internal Medicine

## 2016-05-25 ENCOUNTER — Encounter (HOSPITAL_COMMUNITY): Payer: Self-pay | Admitting: Internal Medicine

## 2016-08-31 ENCOUNTER — Ambulatory Visit (INDEPENDENT_AMBULATORY_CARE_PROVIDER_SITE_OTHER): Payer: BLUE CROSS/BLUE SHIELD | Admitting: Otolaryngology

## 2016-08-31 DIAGNOSIS — J322 Chronic ethmoidal sinusitis: Secondary | ICD-10-CM | POA: Diagnosis not present

## 2016-08-31 DIAGNOSIS — J330 Polyp of nasal cavity: Secondary | ICD-10-CM

## 2016-10-20 ENCOUNTER — Encounter: Payer: Self-pay | Admitting: Allergy & Immunology

## 2016-10-20 ENCOUNTER — Encounter (INDEPENDENT_AMBULATORY_CARE_PROVIDER_SITE_OTHER): Payer: Self-pay

## 2016-10-20 ENCOUNTER — Ambulatory Visit (INDEPENDENT_AMBULATORY_CARE_PROVIDER_SITE_OTHER): Payer: BLUE CROSS/BLUE SHIELD | Admitting: Allergy & Immunology

## 2016-10-20 VITALS — BP 130/70 | HR 60 | Temp 97.9°F | Ht 70.47 in | Wt 249.6 lb

## 2016-10-20 DIAGNOSIS — J339 Nasal polyp, unspecified: Secondary | ICD-10-CM

## 2016-10-20 DIAGNOSIS — G4733 Obstructive sleep apnea (adult) (pediatric): Secondary | ICD-10-CM | POA: Diagnosis not present

## 2016-10-20 DIAGNOSIS — J3089 Other allergic rhinitis: Secondary | ICD-10-CM | POA: Insufficient documentation

## 2016-10-20 MED ORDER — AZELASTINE HCL 0.1 % NA SOLN: 2.0000 | Freq: Two times a day (BID) | NASAL | 12 refills | Status: DC

## 2016-10-20 MED ORDER — MONTELUKAST SODIUM 10 MG PO TABS: 10.0000 mg | ORAL_TABLET | Freq: Every day | ORAL | 5 refills | Status: DC

## 2016-10-20 NOTE — Progress Notes (Signed)
NEW PATIENT  Date of Service/Encounter:  10/20/16  Referring provider: Rosita Fire, MD   Assessment:   Nasal polyposis  Perennial non-seasonal allergic rhinitis (molds and dust mites)  Obstructive sleep apnea   Plan/Recommendations:   1. Chronic rhinitis with nasal polyposis - Testing today showed: positives to mold and dust mites - Avoidance measures provided. - We will start you on a new nasal spray: Dymista two sprays per nostril daily - We did not have a sample unfortunately, therefore we will send in fluticasone nasal spray in conjunction with azelastine nasal spray - We will also start Singulair (montelukast) 10mg  daily to help control the nasal polyps. - If this does not work, we will plan to change to [facility] nasal spray at the next visit.  - We also briefly discussed allergy shots, and he is well aware of these since his wife gets them.   2. Return in about 4 weeks (around 11/17/2016).   Subjective:   Terry Gonzalez is a 51 y.o. male presenting today for evaluation of  Chief Complaint  Patient presents with  . Allergic Rhinitis     Terry Gonzalez has a history of the following: Patient Active Problem List   Diagnosis Date Noted  . Obstructive sleep apnea 10/20/2016  . Non-seasonal allergic rhinitis due to fungal spores 10/20/2016  . Nasal polyposis 10/20/2016    History obtained from: chart review and patient.  Terry Gonzalez was referred by Rosita Fire, MD.     Terry Gonzalez is a 51 y.o. male presenting for an allergy evaluation. He has a history of nasal congestion and difficult breathing especially at night. He has a history of polyps and is s/p polypectomy. He is followed by Dr. Benjamine Mola. His last polypectomy was three years ago. Currently he is on Flonase and prednisone. He estimates that he gets a six day course of prednisone every 3-4 months. He feels that he started having symptoms 6-7 years ago. Symptoms do not seem to change with any NSAIDs. He does endorse  postnasal drip every morning. He feels that the spring is the worst time of the year. He does endorse intermittent itchy watery eyes. He has only remained on the fluticasone two sprays per nostril once daily prior to bed time. He has never been allergy tested.   Terry Gonzalez has never needed an inhaler at all. He does endorse mouth breathing. He does cough at night but it is better with the CPAP machine. He denies food allergies as well as eczema and urticaria.   Otherwise, there is no history of other atopic diseases, including asthma, drug allergies, food allergies, stinging insect allergies, or urticaria. There is no significant infectious history and cannot remember the last time he got antibiotics. Vaccinations are up to date.    Past Medical History: Patient Active Problem List   Diagnosis Date Noted  . Obstructive sleep apnea 10/20/2016  . Non-seasonal allergic rhinitis due to fungal spores 10/20/2016  . Nasal polyposis 10/20/2016    Medication List:  Allergies as of 10/20/2016   No Known Allergies     Medication List       Accurate as of 10/20/16 10:46 AM. Always use your most recent med list.          fluticasone 50 MCG/ACT nasal spray Commonly known as:  FLONASE Place 1 spray into both nostrils daily as needed for allergies.   polyethylene glycol-electrolytes 420 g solution Commonly known as:  TRILYTE Take 4,000 mLs by mouth as directed.  Birth History: non-contributory.    Developmental History: non-contributory.   Past Surgical History: Past Surgical History:  Procedure Laterality Date  . COLONOSCOPY N/A 05/20/2016   Procedure: COLONOSCOPY;  Surgeon: Daneil Dolin, MD;  [facility];  Service: Endoscopy;  Laterality: N/A;  12:45 PM  . MASS EXCISION  06/20/2012   Procedure: EXCISION MASS;  Surgeon: Scherry Ran, MD;  Location: [facility];  Service: General;  Laterality: Left;  Excision of Mass Left Lower Quadrant  . NASAL POLYP SURGERY  2013     Dr. Redmond Pulling, ENT [facility])  . SINUS ENDO W/FUSION Bilateral 07/24/2013   Procedure: ENDOSCOPIC SINUS SURGERY WITH  TOTAL ETHMOIDECTOMY, MAXILLARY ANTROSTOMY, AND FRONTAL RECESS EXPLORATION WITH FUSION NAVIGATION;  Surgeon: Ascencion Dike, MD;  Location: [place];  Service: ENT;  Laterality: Bilateral;  sinus     Family History: Family History  Problem Relation Age of Onset  . Asthma Mother   . Allergic rhinitis Sister   . Colon cancer Neg Hx   . Angioedema Neg Hx   . Atopy Neg Hx   . Eczema Neg Hx   . Immunodeficiency Neg Hx   . Urticaria Neg Hx      Social History: Terry Gonzalez lives at home with his wife and three children (ages 60 and twin 41yo). 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 There are no pets at home. They live in a 10yo home with wood flooring in the main living areas and carpeting in the bedroom. There are no dust mite coverings on the bedding. There is no tobacco exposure.     Review of Systems: a 14-point review of systems is pertinent for what is mentioned in HPI.  Otherwise, all other systems were negative. Constitutional: negative other than that listed in the HPI Eyes: negative other than that listed in the HPI Ears, nose, mouth, throat, and face: negative other than that listed in the HPI Respiratory: negative other than that listed in the HPI Cardiovascular: negative other than that listed in the HPI Gastrointestinal: negative other than that listed in the HPI Genitourinary: negative other than that listed in the HPI Integument: negative other than that listed in the HPI Hematologic: negative other than that listed in the HPI Musculoskeletal: negative other than that listed in the HPI Neurological: negative other than that listed in the HPI Allergy/Immunologic: negative other than that listed in the HPI    Objective:   Blood pressure 130/70, pulse 60, temperature 97.9 F (36.6 C), temperature source Oral, height  5' 10.47" (1.79 m), weight 249 lb 9.6 oz (113.2 kg). Body mass index is 35.34 kg/m.   Physical Exam:  General: Alert, interactive, in no acute distress. Very pleasant. Obese.  Eyes: No conjunctival injection present on the right, No conjunctival injection present on the left, PERRL bilaterally, No discharge on the right, No discharge on the left and No Horner-Trantas dots present Ears: Right TM pearly gray with normal light reflex, Left TM pearly gray with normal light reflex, Right TM intact without perforation and Left TM intact without perforation.  Nose/Throat: External nose within normal limits, nasal crease present and septum midline, turbinates markedly edematous with clear discharge, polyps appeciated in the right nasal cavity, post-pharynx erythematous with cobblestoning in the posterior oropharynx. Tonsils 2+ without exudates Neck: Supple without thyromegaly.  Adenopathy: no enlarged lymph nodes appreciated in the anterior cervical, occipital, axillary, epitrochlear, inguinal, or popliteal regions Lungs: Clear to auscultation without  wheezing, rhonchi or rales. No increased work of breathing. CV: Normal S1/S2, no murmurs. Capillary refill <2 seconds.  Abdomen: Nondistended, nontender. No guarding or rebound tenderness. Bowel sounds present in all fields and hyperactive  Skin: Warm and dry, without lesions or rashes. Extremities:  No clubbing, cyanosis or edema. Neuro:   Grossly intact. No focal deficits appreciated. Responsive to questions.  Diagnostic studies:   Allergy Studies:   Indoor/Outdoor Percutaneous Adult Environmental Panel: positive to Aspergillus, Penicillium, Fusarium and Tricophyton. Otherwise negative with adequate controls.  Indoor/Outdoor Selected Intradermal Environmental Panel: positive to mold mix #1, mold mix #3 and mite mix. Otherwise negative with adequate controls.     Terry Marvel, MD [facility] [place]

## 2016-10-20 NOTE — Patient Instructions (Addendum)
1. Chronic rhinitis with nasal polyposis - Testing today showed: positives to mold and dust mites - Avoidance measures provided. - We will start you on a new nasal spray: Dymista two sprays per nostril daily - We will also start Singulair (montelukast) 10mg  daily to help control the nasal polyps.  2. Return in about 4 weeks (around 11/17/2016).  Please inform us of any Emergency Department visits, hospitalizations, or changes in symptoms. Call us before going to the ED for breathing or allergy symptoms since we might be able to fit you in for a sick visit. Feel free to contact us anytime with any questions, problems, or concerns.  It was a pleasure to meet you today! Happy spring!   Websites that have reliable patient information: 1. American Academy of Asthma, Allergy, and Immunology: www.aaaai.org 2. Food Allergy Research and Education (FARE): foodallergy.org 3. Mothers of Asthmatics: http://www.asthmacommunitynetwork.org 4. American College of Allergy, Asthma, and Immunology: www.acaai.org  Control of Mold Allergen  Mold and fungi can grow on a variety of surfaces provided certain temperature and moisture conditions exist.  Outdoor molds grow on plants, decaying vegetation and soil.  The major outdoor mold, Alternaria and Cladosporium, are found in very high numbers during hot and dry conditions.  Generally, a late Summer - Fall peak is seen for common outdoor fungal spores.  Rain will temporarily lower outdoor mold spore count, but counts rise rapidly when the rainy period ends.  The most important indoor molds are Aspergillus and Penicillium.  Dark, humid and poorly ventilated basements are ideal sites for mold growth.  The next most common sites of mold growth are the bathroom and the kitchen.  Outdoor [employer] 1. Use air conditioning and keep windows closed 2. Avoid exposure to decaying vegetation. 3. Avoid leaf raking. 4. Avoid grain handling. 5. Consider wearing a face mask if  working in moldy areas.  Indoor Mold Control 1. Maintain humidity below 50%. 2. Clean washable surfaces with 5% bleach solution. 3. Remove sources e.g. contaminated carpets.  Control of House Dust Mite Allergen    House dust mites play a major role in allergic asthma and rhinitis.  They occur in environments with high humidity wherever human skin, the food for dust mites is found. High levels have been detected in dust obtained from mattresses, pillows, carpets, upholstered furniture, bed covers, clothes and soft toys.  The principal allergen of the house dust mite is found in its feces.  A gram of dust may contain 1,000 mites and 250,000 fecal particles.  Mite antigen is easily measured in the air during house cleaning activities.    1. Encase mattresses, including the box spring, and pillow, in an air tight cover.  Seal the zipper end of the encased mattresses with wide adhesive tape. 2. Wash the bedding in water of 130 degrees Farenheit weekly.  Avoid cotton comforters/quilts and flannel bedding: the most ideal bed covering is the dacron comforter. 3. Remove all upholstered furniture from the bedroom. 4. Remove carpets, carpet padding, rugs, and non-washable window drapes from the bedroom.  Wash drapes weekly or use plastic window coverings. 5. Remove all non-washable stuffed toys from the bedroom.  Wash stuffed toys weekly. 6. Have the room cleaned frequently with a vacuum cleaner and a damp dust-mop.  The patient should not be in a room which is being cleaned and should wait 1 hour after cleaning before going into the room. 7. Close and seal all heating outlets in the bedroom.  Otherwise, the room will  become filled with dust-laden air.  An electric heater can be used to heat the room. 8. Reduce indoor humidity to less than 50%.  Do not use a humidifier. 9.

## 2017-05-11 ENCOUNTER — Encounter: Payer: Self-pay | Admitting: General Surgery

## 2017-05-11 ENCOUNTER — Ambulatory Visit: Payer: BLUE CROSS/BLUE SHIELD | Admitting: General Surgery

## 2017-05-11 VITALS — BP 133/76 | HR 66 | Temp 97.7°F | Ht 72.0 in | Wt 253.0 lb

## 2017-05-11 DIAGNOSIS — L723 Sebaceous cyst: Secondary | ICD-10-CM | POA: Diagnosis not present

## 2017-05-11 NOTE — H&P (Signed)
Terry Gonzalez; [id number]; 08/31/1965   HPI Patient is a 51 year old white male who was referred to my care by Dr. Legrand Rams for evaluation and treatment of a mass on the back of his neck.  Patient states is been present for several months, but seems to be increasing in size and is tender to touch.  He currently has no pain there.  He has never had any drainage from that area. Past Medical History:  Diagnosis Date  . Seasonal allergies   . Sinusitis   . Sleep apnea    uses a cpap    Past Surgical History:  Procedure Laterality Date  . COLONOSCOPY N/A 05/20/2016   Procedure: COLONOSCOPY;  Surgeon: Daneil Dolin, MD;  [facility];  Service: Endoscopy;  Laterality: N/A;  12:45 PM  . MASS EXCISION  06/20/2012   Procedure: EXCISION MASS;  Surgeon: Scherry Ran, MD;  Location: [facility];  Service: General;  Laterality: Left;  Excision of Mass Left Lower Quadrant  . NASAL POLYP SURGERY  2013   Dr. Redmond Pulling, ENT [facility])  . SINUS ENDO W/FUSION Bilateral 07/24/2013   Procedure: ENDOSCOPIC SINUS SURGERY WITH  TOTAL ETHMOIDECTOMY, MAXILLARY ANTROSTOMY, AND FRONTAL RECESS EXPLORATION WITH FUSION NAVIGATION;  Surgeon: Ascencion Dike, MD;  Location: [place];  Service: ENT;  Laterality: Bilateral;  sinus    Family History  Problem Relation Age of Onset  . Asthma Mother   . Allergic rhinitis Sister   . Colon cancer Neg Hx   . Angioedema Neg Hx   . Atopy Neg Hx   . Eczema Neg Hx   . Immunodeficiency Neg Hx   . Urticaria Neg Hx     Current Outpatient Medications on File Prior to Visit  Medication Sig Dispense Refill  . azelastine (ASTELIN) 0.1 % nasal spray Place 2 sprays into both nostrils 2 (two) times daily. Use in each nostril as directed 30 mL 12  . fluticasone (FLONASE) 50 MCG/ACT nasal spray Place 1 spray into both nostrils daily as needed for allergies.  0  . montelukast (SINGULAIR) 10 MG tablet Take 1 tablet (10 mg total) by mouth at bedtime. 30 tablet 5  .  polyethylene glycol-electrolytes (TRILYTE) 420 g solution Take 4,000 mLs by mouth as directed. 4000 mL 0   No current facility-administered medications on file prior to visit.     No Known Allergies  Social History   Substance and Sexual Activity  Alcohol Use No    Social History   Tobacco Use  Smoking Status Current Every Day Smoker  . Packs/day: 0.25  . Years: 25.00  . Pack years: 6.25  . Types: Cigarettes  Smokeless Tobacco Never Used    Review of Systems  Constitutional: Negative.   HENT: Negative.   Eyes: Negative.   Respiratory: Negative.   Cardiovascular: Negative.   Gastrointestinal: Negative.   Genitourinary: Negative.   Musculoskeletal: Negative.   Skin: Negative.   Neurological: Negative.   Endo/Heme/Allergies: Negative.   Psychiatric/Behavioral: Negative.     Objective   Vitals:   05/11/17 1322  BP: 133/76  Pulse: 66  Temp: 97.7 F (36.5 C)    Physical Exam  Constitutional: He is oriented to person, place, and time and well-developed, well-nourished, and in no distress.  HENT:  Head: Normocephalic and atraumatic.  Neck: Normal range of motion. Neck supple.  3 cm ovoid subcutaneous mobile mass noted at the base of the neck posteriorly, towards the right.  Slightly tender to touch.  No  erythema or drainage noted.  Cardiovascular: Normal rate, regular rhythm and normal heart sounds. Exam reveals no gallop and no friction rub.  No murmur heard. Pulmonary/Chest: Effort normal and breath sounds normal. No respiratory distress. He has no wheezes. He has no rales.  Lymphadenopathy:    He has no cervical adenopathy.  Neurological: He is alert and oriented to person, place, and time.  Skin: Skin is warm and dry.  Vitals reviewed.  Dr. Josephine Cables notes reviewed. Assessment  Sebaceous cyst, neck, 3 cm in size Plan   Patient is scheduled for excision of the sebaceous cyst, neck on 05/17/2017.  The risks and benefits of the procedure including bleeding,  infection, and recurrence of the cyst were fully explained to the patient, who gave informed consent.

## 2017-05-11 NOTE — Patient Instructions (Signed)
Epidermal Cyst An epidermal cyst is a small, painless lump under your skin. It may be called an epidermal inclusion cyst or an infundibular cyst. The cyst contains a grayish-white, bad-smelling substance (keratin). It is important not to pop epidermal cysts yourself. These cysts are usually harmless (benign), but they can get infected. Symptoms of infection may include:  Redness.  Inflammation.  Tenderness.  Warmth.  Fever.  A grayish-white, bad-smelling substance draining from the cyst.  Pus draining from the cyst.  Follow these instructions at home:  Take over-the-counter and prescription medicines only as told by your doctor.  If you were prescribed an antibiotic, use it as told by your doctor. Do not stop using the antibiotic even if you start to feel better.  Keep the area around your cyst clean and dry.  Wear loose, dry clothing.  Do not try to pop your cyst.  Avoid touching your cyst.  Check your cyst every day for signs of infection.  Keep all follow-up visits as told by your doctor. This is important. How is this prevented?  Wear clean, dry, clothing.  Avoid wearing tight clothing.  Keep your skin clean and dry. Shower or take baths every day.  Wash your body with a benzoyl peroxide wash when you shower or bathe. Contact a health care provider if:  Your cyst has symptoms of infection.  Your condition is not improving or is getting worse.  You have a cyst that looks different from other cysts you have had.  You have a fever. Get help right away if:  Redness spreads from the cyst into the surrounding area. This information is not intended to replace advice given to you by your health care provider. Make sure you discuss any questions you have with your health care provider. Document Released: 07/02/2004 Document Revised: 01/22/2016 Document Reviewed: 03/27/2015 Elsevier Interactive Patient Education  2018 Elsevier Inc.  

## 2017-05-11 NOTE — Progress Notes (Signed)
Terry Gonzalez; [id number]; [birth year]   HPI Patient is a 51 year old white male who was referred to my care by Dr. Legrand Rams for evaluation and treatment of a mass on the back of his neck.  Patient states is been present for several months, but seems to be increasing in size and is tender to touch.  He currently has no pain there.  He has never had any drainage from that area. Past Medical History:  Diagnosis Date  . Seasonal allergies   . Sinusitis   . Sleep apnea    uses a cpap    Past Surgical History:  Procedure Laterality Date  . COLONOSCOPY N/A 05/20/2016   Procedure: COLONOSCOPY;  Surgeon: Daneil Dolin, MD;  [facility];  Service: Endoscopy;  Laterality: N/A;  12:45 PM  . MASS EXCISION  06/20/2012   Procedure: EXCISION MASS;  Surgeon: Scherry Ran, MD;  Location: [facility];  Service: General;  Laterality: Left;  Excision of Mass Left Lower Quadrant  . NASAL POLYP SURGERY  2013   Dr. Redmond Pulling, ENT [facility])  . SINUS ENDO W/FUSION Bilateral 07/24/2013   Procedure: ENDOSCOPIC SINUS SURGERY WITH  TOTAL ETHMOIDECTOMY, MAXILLARY ANTROSTOMY, AND FRONTAL RECESS EXPLORATION WITH FUSION NAVIGATION;  Surgeon: Ascencion Dike, MD;  Location: [place];  Service: ENT;  Laterality: Bilateral;  sinus    Family History  Problem Relation Age of Onset  . Asthma Mother   . Allergic rhinitis Sister   . Colon cancer Neg Hx   . Angioedema Neg Hx   . Atopy Neg Hx   . Eczema Neg Hx   . Immunodeficiency Neg Hx   . Urticaria Neg Hx     Current Outpatient Medications on File Prior to Visit  Medication Sig Dispense Refill  . azelastine (ASTELIN) 0.1 % nasal spray Place 2 sprays into both nostrils 2 (two) times daily. Use in each nostril as directed 30 mL 12  . fluticasone (FLONASE) 50 MCG/ACT nasal spray Place 1 spray into both nostrils daily as needed for allergies.  0  . montelukast (SINGULAIR) 10 MG tablet Take 1 tablet (10 mg total) by mouth at bedtime. 30 tablet 5  .  polyethylene glycol-electrolytes (TRILYTE) 420 g solution Take 4,000 mLs by mouth as directed. 4000 mL 0   No current facility-administered medications on file prior to visit.     No Known Allergies  Social History   Substance and Sexual Activity  Alcohol Use No    Social History   Tobacco Use  Smoking Status Current Every Day Smoker  . Packs/day: 0.25  . Years: 25.00  . Pack years: 6.25  . Types: Cigarettes  Smokeless Tobacco Never Used    Review of Systems  Constitutional: Negative.   HENT: Negative.   Eyes: Negative.   Respiratory: Negative.   Cardiovascular: Negative.   Gastrointestinal: Negative.   Genitourinary: Negative.   Musculoskeletal: Negative.   Skin: Negative.   Neurological: Negative.   Endo/Heme/Allergies: Negative.   Psychiatric/Behavioral: Negative.     Objective   Vitals:   05/11/17 1322  BP: 133/76  Pulse: 66  Temp: 97.7 F (36.5 C)    Physical Exam  Constitutional: He is oriented to person, place, and time and well-developed, well-nourished, and in no distress.  HENT:  Head: Normocephalic and atraumatic.  Neck: Normal range of motion. Neck supple.  3 cm ovoid subcutaneous mobile mass noted at the base of the neck posteriorly, towards the right.  Slightly tender to touch.  No  erythema or drainage noted.  Cardiovascular: Normal rate, regular rhythm and normal heart sounds. Exam reveals no gallop and no friction rub.  No murmur heard. Pulmonary/Chest: Effort normal and breath sounds normal. No respiratory distress. He has no wheezes. He has no rales.  Lymphadenopathy:    He has no cervical adenopathy.  Neurological: He is alert and oriented to person, place, and time.  Skin: Skin is warm and dry.  Vitals reviewed.  Dr. Josephine Cables notes reviewed. Assessment  Sebaceous cyst, neck, 3 cm in size Plan   Patient is scheduled for excision of the sebaceous cyst, neck on 05/17/2017.  The risks and benefits of the procedure including bleeding,  infection, and recurrence of the cyst were fully explained to the patient, who gave informed consent.

## 2017-05-12 NOTE — Patient Instructions (Signed)
Terry Gonzalez  05/12/2017     @PREFPERIOPPHARMACY @   Your procedure is scheduled on  05/17/2017   Report to [facility] at  52   A.M.  Call this number if you have problems the morning of surgery:  [phone]   Remember:  Do not eat food or drink liquids after midnight.  Take these medicines the morning of surgery with A SIP OF WATER  None   Do not wear jewelry, make-up or nail polish.  Do not wear lotions, powders, or perfumes, or deoderant.  Do not shave 48 hours prior to surgery.  Men may shave face and neck.  Do not bring valuables to the hospital.  [facility] is not responsible for any belongings or valuables.  Contacts, dentures or bridgework may not be worn into surgery.  Leave your suitcase in the car.  After surgery it may be brought to your room.  For patients admitted to the hospital, discharge time will be determined by your treatment team.  Patients discharged the day of surgery will not be allowed to drive home.   Name and phone number of your driver:   Family    Special instructions:  None  Please read over the following fact sheets that you were given. Anesthesia Post-op Instructions and Care and Recovery After Surgery      Epidermal Cyst Removal Epidermal cyst removal is a procedure to remove a sac of oily material that forms under your skin (epidermal cyst). Epidermal cysts may also be called epidermoid cysts or keratin cysts. Normally, the skin secretes this oily material through a gland or a hair follicle. This type of cyst usually results when a skin gland or hair follicle becomes blocked. You may need this procedure if you have an epidermal cyst that becomes large, uncomfortable, or infected. Tell a health care provider about:  Any allergies you have.  All medicines you are taking, including vitamins, herbs, eye drops, creams, and over-the-counter medicines.  Any problems you or family members have had with anesthetic  medicines.  Any blood disorders you have.  Any surgeries you have had.  Any medical conditions you have. What are the risks? Generally, this is a safe procedure. However, problems may occur, including:  Developing another cyst.  Bleeding.  Infection.  Scarring.  What happens before the procedure?  Ask your health care provider about: ? Changing or stopping your regular medicines. This is especially important if you are taking diabetes medicines or blood thinners. ? Taking medicines such as aspirin and ibuprofen. These medicines can thin your blood. Do not take these medicines before your procedure if your health care provider instructs you not to.  If you have an infected cyst, you may have to take antibiotic medicines before or after the cyst removal. Take your antibiotics as directed by your health care provider. Finish all of the medicine even if you start to feel better.  Take a shower on the morning of your procedure. Your health care provider may ask you to use a germ-killing (antiseptic) soap. What happens during the procedure?  You will be given a medicine that numbs the area (local anesthetic).  The skin around the cyst will be cleaned with a germ-killing solution (antiseptic).  Your health care provider will make a small surgical incision over the cyst.  The cyst will be separated from the surrounding tissues that are under your skin.  If possible, the cyst will be removed  undamaged (intact).  If the cyst bursts (ruptures), it will need to be removed in pieces.  After the cyst is removed, your health care provider will control any bleeding and close the incision with small stitches (sutures). Small incisions may not need sutures, and the bleeding will be controlled by applying direct pressure with gauze.  Your health care provider may apply antibiotic ointment and a light bandage (dressing) over the incision. This procedure may vary among health care providers  and hospitals. What happens after the procedure?  If your cyst ruptured during surgery, you may need to take antibiotic medicine. If you were prescribed an antibiotic medicine, finish all of it even if you start to feel better. This information is not intended to replace advice given to you by your health care provider. Make sure you discuss any questions you have with your health care provider. Document Released: 05/22/2000 Document Revised: 10/31/2015 Document Reviewed: 02/07/2014 Elsevier Interactive Patient Education  2018 [place]. Epidermal Cyst Removal, Care After Refer to this sheet in the next few weeks. These instructions provide you with information about caring for yourself after your procedure. Your health care provider may also give you more specific instructions. Your treatment has been planned according to current medical practices, but problems sometimes occur. Call your health care provider if you have any problems or questions after your procedure. What can I expect after the procedure? After the procedure, it is common to have:  Soreness in the area where your cyst was removed.  Tightness or itching from your skin sutures.  Follow these instructions at home:  Take medicines only as directed by your health care provider.  If you were prescribed an antibiotic medicine, finish all of it even if you start to feel better.  Use antibiotic ointment as directed by your health care provider. Follow the instructions carefully.  There are many different ways to close and cover an incision, including stitches (sutures), skin glue, and adhesive strips. Follow your health care provider's instructions about: ? Incision care. ? Bandage (dressing) changes and removal. ? Incision closure removal.  Keep the bandage (dressing) dry until your health care provider says that it can be removed. Take sponge baths only. Ask your health care provider when you can start showering or taking  a bath.  After your dressing is off, check your incision every day for signs of infection. Watch for: ? Redness, swelling, or pain. ? Fluid, blood, or pus.  You can return to your normal activities. Do not do anything that stretches or puts pressure on your incision.  You can return to your normal diet.  Keep all follow-up visits as directed by your health care provider. This is important. Contact a health care provider if:  You have a fever.  Your incision bleeds.  You have redness, swelling, or pain in the incision area.  You have fluid, blood, or pus coming from your incision.  Your cyst comes back after surgery. This information is not intended to replace advice given to you by your health care provider. Make sure you discuss any questions you have with your health care provider. Document Released: 06/15/2014 Document Revised: 10/31/2015 Document Reviewed: 02/07/2014 Elsevier Interactive Patient Education  2018 [place] Anesthesia is a term that refers to techniques, procedures, and medicines that help a person stay safe and comfortable during a medical procedure. Monitored anesthesia care, or sedation, is one type of anesthesia. Your anesthesia specialist may recommend sedation if you will  be having a procedure that does not require you to be unconscious, such as:  Cataract surgery.  A dental procedure.  A biopsy.  A colonoscopy.  During the procedure, you may receive a medicine to help you relax (sedative). There are three levels of sedation:  Mild sedation. At this level, you may feel awake and relaxed. You will be able to follow directions.  Moderate sedation. At this level, you will be sleepy. You may not remember the procedure.  Deep sedation. At this level, you will be asleep. You will not remember the procedure.  The more medicine you are given, the deeper your level of sedation will be. Depending on how you respond to the  procedure, the anesthesia specialist may change your level of sedation or the type of anesthesia to fit your needs. An anesthesia specialist will monitor you closely during the procedure. Let your health care provider know about:  Any allergies you have.  All medicines you are taking, including vitamins, herbs, eye drops, creams, and over-the-counter medicines.  Any use of steroids (by mouth or as a cream).  Any problems you or family members have had with sedatives and anesthetic medicines.  Any blood disorders you have.  Any surgeries you have had.  Any medical conditions you have, such as sleep apnea.  Whether you are pregnant or may be pregnant.  Any use of cigarettes, alcohol, or street drugs. What are the risks? Generally, this is a safe procedure. However, problems may occur, including:  Getting too much medicine (oversedation).  Nausea.  Allergic reaction to medicines.  Trouble breathing. If this happens, a breathing tube may be used to help with breathing. It will be removed when you are awake and breathing on your own.  Heart trouble.  Lung trouble.  Before the procedure Staying hydrated Follow instructions from your health care provider about hydration, which may include:  Up to 2 hours before the procedure - you may continue to drink clear liquids, such as water, clear fruit juice, black coffee, and plain tea.  Eating and drinking restrictions Follow instructions from your health care provider about eating and drinking, which may include:  8 hours before the procedure - stop eating heavy meals or foods such as meat, fried foods, or fatty foods.  6 hours before the procedure - stop eating light meals or foods, such as toast or cereal.  6 hours before the procedure - stop drinking milk or drinks that contain milk.  2 hours before the procedure - stop drinking clear liquids.  Medicines Ask your health care provider about:  Changing or stopping your  regular medicines. This is especially important if you are taking diabetes medicines or blood thinners.  Taking medicines such as aspirin and ibuprofen. These medicines can thin your blood. Do not take these medicines before your procedure if your health care provider instructs you not to.  Tests and exams  You will have a physical exam.  You may have blood tests done to show: ? How well your kidneys and liver are working. ? How well your blood can clot.  General instructions  Plan to have someone take you home from the hospital or clinic.  If you will be going home right after the procedure, plan to have someone with you for 24 hours.  What happens during the procedure?  Your blood pressure, heart rate, breathing, level of pain and overall condition will be monitored.  An IV tube will be inserted into one of your veins.  Your anesthesia specialist will give you medicines as needed to keep you comfortable during the procedure. This may mean changing the level of sedation.  The procedure will be performed. After the procedure  Your blood pressure, heart rate, breathing rate, and blood oxygen level will be monitored until the medicines you were given have worn off.  Do not drive for 24 hours if you received a sedative.  You may: ? Feel sleepy, clumsy, or nauseous. ? Feel forgetful about what happened after the procedure. ? Have a sore throat if you had a breathing tube during the procedure. ? Vomit. This information is not intended to replace advice given to you by your health care provider. Make sure you discuss any questions you have with your health care provider. Document Released: 02/18/2005 Document Revised: 11/01/2015 Document Reviewed: 09/15/2015 Elsevier Interactive Patient Education  2018 [place], Care After These instructions provide you with information about caring for yourself after your procedure. Your health care provider may  also give you more specific instructions. Your treatment has been planned according to current medical practices, but problems sometimes occur. Call your health care provider if you have any problems or questions after your procedure. What can I expect after the procedure? After your procedure, it is common to:  Feel sleepy for several hours.  Feel clumsy and have poor balance for several hours.  Feel forgetful about what happened after the procedure.  Have poor judgment for several hours.  Feel nauseous or vomit.  Have a sore throat if you had a breathing tube during the procedure.  Follow these instructions at home: For at least 24 hours after the procedure:   Do not: ? Participate in activities in which you could fall or become injured. ? Drive. ? Use heavy machinery. ? Drink alcohol. ? Take sleeping pills or medicines that cause drowsiness. ? Make important decisions or sign legal documents. ? Take care of children on your own.  Rest. Eating and drinking  Follow the diet that is recommended by your health care provider.  If you vomit, drink water, juice, or soup when you can drink without vomiting.  Make sure you have little or no nausea before eating solid foods. General instructions  Have a responsible adult stay with you until you are awake and alert.  Take over-the-counter and prescription medicines only as told by your health care provider.  If you smoke, do not smoke without supervision.  Keep all follow-up visits as told by your health care provider. This is important. Contact a health care provider if:  You keep feeling nauseous or you keep vomiting.  You feel light-headed.  You develop a rash.  You have a fever. Get help right away if:  You have trouble breathing. This information is not intended to replace advice given to you by your health care provider. Make sure you discuss any questions you have with your health care provider. Document  Released: 09/15/2015 Document Revised: 01/15/2016 Document Reviewed: 09/15/2015 Elsevier Interactive Patient Education  [employer].

## 2017-05-14 ENCOUNTER — Other Ambulatory Visit: Payer: Self-pay

## 2017-05-14 ENCOUNTER — Encounter (HOSPITAL_COMMUNITY)
Admission: RE | Admit: 2017-05-14 | Discharge: 2017-05-14 | Disposition: A | Discharge status: Home / Self Care | Payer: BLUE CROSS/BLUE SHIELD | Source: Ambulatory Visit | Attending: General Surgery | Admitting: General Surgery

## 2017-05-14 ENCOUNTER — Encounter (HOSPITAL_COMMUNITY): Payer: Self-pay

## 2017-05-14 DIAGNOSIS — Z79899 Other long term (current) drug therapy: Secondary | ICD-10-CM | POA: Diagnosis not present

## 2017-05-14 DIAGNOSIS — G4730 Sleep apnea, unspecified: Secondary | ICD-10-CM | POA: Diagnosis not present

## 2017-05-14 DIAGNOSIS — D170 Benign lipomatous neoplasm of skin and subcutaneous tissue of head, face and neck: Secondary | ICD-10-CM | POA: Diagnosis present

## 2017-05-14 DIAGNOSIS — F17210 Nicotine dependence, cigarettes, uncomplicated: Secondary | ICD-10-CM | POA: Diagnosis not present

## 2017-05-14 LAB — CBC WITH DIFFERENTIAL/PLATELET
BASOS ABS: 0 10*3/uL (ref 0.0–0.1)
Basophils Relative: 1 %
EOS PCT: 7 %
Eosinophils Absolute: 0.3 10*3/uL (ref 0.0–0.7)
HCT: 44.9 % (ref 39.0–52.0)
Hemoglobin: 15.1 g/dL (ref 13.0–17.0)
LYMPHS PCT: 53 %
Lymphs Abs: 2.5 10*3/uL (ref 0.7–4.0)
MCH: 30.1 pg (ref 26.0–34.0)
MCHC: 33.6 g/dL (ref 30.0–36.0)
MCV: 89.4 fL (ref 78.0–100.0)
Monocytes Absolute: 0.5 10*3/uL (ref 0.1–1.0)
Monocytes Relative: 10 %
Neutro Abs: 1.4 10*3/uL — ABNORMAL LOW (ref 1.7–7.7)
Neutrophils Relative %: 29 %
PLATELETS: 206 10*3/uL (ref 150–400)
RBC: 5.02 MIL/uL (ref 4.22–5.81)
RDW: 13.2 % (ref 11.5–15.5)
WBC: 4.7 10*3/uL (ref 4.0–10.5)

## 2017-05-14 LAB — BASIC METABOLIC PANEL
ANION GAP: 7 (ref 5–15)
BUN: 16 mg/dL (ref 6–20)
CO2: 23 mmol/L (ref 22–32)
Calcium: 8.9 mg/dL (ref 8.9–10.3)
Chloride: 105 mmol/L (ref 101–111)
Creatinine, Ser: 0.74 mg/dL (ref 0.61–1.24)
GFR calc Af Amer: >60 mL/min (ref >60)
GFR calc non Af Amer: >60 mL/min (ref >60)
Glucose, Bld: 123 mg/dL — ABNORMAL HIGH (ref 65–99)
POTASSIUM: 3.7 mmol/L (ref 3.5–5.1)
Sodium: 135 mmol/L (ref 135–145)

## 2017-05-16 MED ORDER — CEFAZOLIN SODIUM-DEXTROSE 2-4 GM/100ML-% IV SOLN
2.0000 g | INTRAVENOUS | Status: AC
  Administered 2017-05-17: 2 g via INTRAVENOUS
  Filled 2017-05-16: qty 100

## 2017-05-17 ENCOUNTER — Encounter (HOSPITAL_COMMUNITY): Payer: Self-pay | Admitting: *Deleted

## 2017-05-17 ENCOUNTER — Ambulatory Visit (HOSPITAL_COMMUNITY)
Admission: RE | Admit: 2017-05-17 | Discharge: 2017-05-17 | Disposition: A | Discharge status: Home / Self Care | Payer: BLUE CROSS/BLUE SHIELD | Source: Ambulatory Visit | Attending: General Surgery | Admitting: General Surgery

## 2017-05-17 ENCOUNTER — Encounter (HOSPITAL_COMMUNITY)
Admission: RE | Disposition: A | Discharge status: Home / Self Care | Payer: Self-pay | Source: Ambulatory Visit | Attending: General Surgery

## 2017-05-17 ENCOUNTER — Ambulatory Visit (HOSPITAL_COMMUNITY): Payer: BLUE CROSS/BLUE SHIELD | Admitting: Anesthesiology

## 2017-05-17 DIAGNOSIS — D170 Benign lipomatous neoplasm of skin and subcutaneous tissue of head, face and neck: Secondary | ICD-10-CM | POA: Diagnosis not present

## 2017-05-17 DIAGNOSIS — G4730 Sleep apnea, unspecified: Secondary | ICD-10-CM | POA: Insufficient documentation

## 2017-05-17 DIAGNOSIS — Z79899 Other long term (current) drug therapy: Secondary | ICD-10-CM | POA: Insufficient documentation

## 2017-05-17 DIAGNOSIS — F17210 Nicotine dependence, cigarettes, uncomplicated: Secondary | ICD-10-CM | POA: Insufficient documentation

## 2017-05-17 HISTORY — PX: MASS EXCISION: SHX2000

## 2017-05-17 SURGERY — EXCISION MASS
Anesthesia: General | Site: Neck

## 2017-05-17 MED ORDER — FENTANYL CITRATE (PF) 100 MCG/2ML IJ SOLN
INTRAMUSCULAR | Status: DC | PRN
  Administered 2017-05-17: 50 ug via INTRAVENOUS
  Administered 2017-05-17 (×2): 25 ug via INTRAVENOUS

## 2017-05-17 MED ORDER — CHLORHEXIDINE GLUCONATE CLOTH 2 % EX PADS: 6.0000 | MEDICATED_PAD | Freq: Once | CUTANEOUS | Status: DC

## 2017-05-17 MED ORDER — LIDOCAINE HCL (PF) 1 % IJ SOLN
INTRAMUSCULAR | Status: AC
  Filled 2017-05-17: qty 5

## 2017-05-17 MED ORDER — POVIDONE-IODINE 10 % OINT PACKET
TOPICAL_OINTMENT | CUTANEOUS | Status: DC | PRN
Start: 2017-05-17 — End: 2017-05-17
  Administered 2017-05-17: 1 via TOPICAL

## 2017-05-17 MED ORDER — FENTANYL CITRATE (PF) 100 MCG/2ML IJ SOLN
INTRAMUSCULAR | Status: AC
  Filled 2017-05-17: qty 2

## 2017-05-17 MED ORDER — ONDANSETRON HCL 4 MG/2ML IJ SOLN
4.0000 mg | Freq: Once | INTRAMUSCULAR | Status: AC
  Administered 2017-05-17: 4 mg via INTRAVENOUS

## 2017-05-17 MED ORDER — EPHEDRINE SULFATE 50 MG/ML IJ SOLN
INTRAMUSCULAR | Status: AC
  Filled 2017-05-17: qty 1

## 2017-05-17 MED ORDER — BUPIVACAINE HCL (PF) 0.5 % IJ SOLN
INTRAMUSCULAR | Status: DC | PRN
  Administered 2017-05-17: 5 mL

## 2017-05-17 MED ORDER — FENTANYL CITRATE (PF) 100 MCG/2ML IJ SOLN: 25.0000 ug | INTRAMUSCULAR | Status: DC | PRN

## 2017-05-17 MED ORDER — HYDROCODONE-ACETAMINOPHEN 5-325 MG PO TABS: 1.0000 | ORAL_TABLET | ORAL | 0 refills | Status: DC | PRN

## 2017-05-17 MED ORDER — PROPOFOL 10 MG/ML IV BOLUS
INTRAVENOUS | Status: DC | PRN
  Administered 2017-05-17: 20 mg via INTRAVENOUS
  Administered 2017-05-17: 30 mg via INTRAVENOUS
  Administered 2017-05-17: 180 mg via INTRAVENOUS
  Administered 2017-05-17: 20 mg via INTRAVENOUS

## 2017-05-17 MED ORDER — KETOROLAC TROMETHAMINE 30 MG/ML IJ SOLN
30.0000 mg | Freq: Once | INTRAMUSCULAR | Status: AC
  Administered 2017-05-17: 30 mg via INTRAVENOUS
  Filled 2017-05-17: qty 1

## 2017-05-17 MED ORDER — BUPIVACAINE HCL (PF) 0.5 % IJ SOLN
INTRAMUSCULAR | Status: AC
  Filled 2017-05-17: qty 30

## 2017-05-17 MED ORDER — MIDAZOLAM HCL 2 MG/2ML IJ SOLN: 1.0000 mg | INTRAMUSCULAR | Status: AC

## 2017-05-17 MED ORDER — ONDANSETRON HCL 4 MG/2ML IJ SOLN
INTRAMUSCULAR | Status: AC
  Filled 2017-05-17: qty 2

## 2017-05-17 MED ORDER — 0.9 % SODIUM CHLORIDE (POUR BTL) OPTIME
TOPICAL | Status: DC | PRN
  Administered 2017-05-17: 1000 mL

## 2017-05-17 MED ORDER — PROPOFOL 10 MG/ML IV BOLUS
INTRAVENOUS | Status: AC
  Filled 2017-05-17: qty 20

## 2017-05-17 MED ORDER — POVIDONE-IODINE 10 % EX OINT
TOPICAL_OINTMENT | CUTANEOUS | Status: AC
  Filled 2017-05-17: qty 1

## 2017-05-17 MED ORDER — EPHEDRINE SULFATE 50 MG/ML IJ SOLN
INTRAMUSCULAR | Status: DC | PRN
  Administered 2017-05-17: 5 mg via INTRAVENOUS
  Administered 2017-05-17: 10 mg via INTRAVENOUS

## 2017-05-17 MED ORDER — LACTATED RINGERS IV SOLN
INTRAVENOUS | Status: DC
  Administered 2017-05-17: 09:00:00 via INTRAVENOUS

## 2017-05-17 MED ORDER — SODIUM CHLORIDE 0.9 % IJ SOLN
INTRAMUSCULAR | Status: AC
  Filled 2017-05-17: qty 10

## 2017-05-17 MED ORDER — MIDAZOLAM HCL 2 MG/2ML IJ SOLN
INTRAMUSCULAR | Status: AC
  Filled 2017-05-17: qty 2

## 2017-05-17 MED ORDER — LIDOCAINE HCL (CARDIAC) 10 MG/ML IV SOLN
INTRAVENOUS | Status: DC | PRN
  Administered 2017-05-17: 50 mg via INTRAVENOUS

## 2017-05-17 MED ORDER — PROPOFOL 10 MG/ML IV BOLUS
INTRAVENOUS | Status: AC
  Filled 2017-05-17: qty 40

## 2017-05-17 SURGICAL SUPPLY — 37 items
BAG HAMPER (MISCELLANEOUS) ×2 IMPLANT
BLADE SURG SZ11 CARB STEEL (BLADE) IMPLANT
CHLORAPREP W/TINT 10.5 ML (MISCELLANEOUS) ×2 IMPLANT
CLOTH BEACON ORANGE TIMEOUT ST (SAFETY) ×2 IMPLANT
COVER LIGHT HANDLE STERIS (MISCELLANEOUS) ×4 IMPLANT
DECANTER SPIKE VIAL GLASS SM (MISCELLANEOUS) ×2 IMPLANT
DERMABOND ADVANCED (GAUZE/BANDAGES/DRESSINGS) ×1
DERMABOND ADVANCED .7 DNX12 (GAUZE/BANDAGES/DRESSINGS) ×1 IMPLANT
DRSG TEGADERM 2-3/8X2-3/4 SM (GAUZE/BANDAGES/DRESSINGS) ×2 IMPLANT
ELECT NEEDLE TIP 2.8 STRL (NEEDLE) ×2 IMPLANT
ELECT REM PT RETURN 9FT ADLT (ELECTROSURGICAL) ×2
ELECTRODE REM PT RTRN 9FT ADLT (ELECTROSURGICAL) ×1 IMPLANT
FORMALIN 10 PREFIL 120ML (MISCELLANEOUS) ×2 IMPLANT
GAUZE SPONGE 4X4 12PLY STRL (GAUZE/BANDAGES/DRESSINGS) ×4 IMPLANT
GLOVE BIOGEL PI IND STRL 7.0 (GLOVE) ×2 IMPLANT
GLOVE BIOGEL PI INDICATOR 7.0 (GLOVE) ×2
GLOVE SURG SS PI 7.5 STRL IVOR (GLOVE) ×2 IMPLANT
GOWN STRL REUS W/ TWL XL LVL3 (GOWN DISPOSABLE) ×1 IMPLANT
GOWN STRL REUS W/TWL LRG LVL3 (GOWN DISPOSABLE) ×2 IMPLANT
GOWN STRL REUS W/TWL XL LVL3 (GOWN DISPOSABLE) ×1
KIT ROOM TURNOVER APOR (KITS) ×2 IMPLANT
MANIFOLD NEPTUNE II (INSTRUMENTS) ×2 IMPLANT
NEEDLE 22X1 1/2 OR ONLY (MISCELLANEOUS)
NEEDLE 22X1.5 STRL (OR ONLY) (MISCELLANEOUS) IMPLANT
NEEDLE HYPO 25X1 1.5 SAFETY (NEEDLE) ×2 IMPLANT
NS IRRIG 1000ML POUR BTL (IV SOLUTION) ×2 IMPLANT
PACK MINOR (CUSTOM PROCEDURE TRAY) ×2 IMPLANT
PAD ARMBOARD 7.5X6 YLW CONV (MISCELLANEOUS) ×2 IMPLANT
SET BASIN LINEN APH (SET/KITS/TRAYS/PACK) ×2 IMPLANT
SPONGE GAUZE 2X2 8PLY STRL LF (GAUZE/BANDAGES/DRESSINGS) ×2 IMPLANT
SUT ETHILON 3 0 FSL (SUTURE) IMPLANT
SUT ETHILON 4 0 PS 2 18 (SUTURE) ×4 IMPLANT
SUT PROLENE 4 0 PS 2 18 (SUTURE) IMPLANT
SUT VIC AB 3-0 SH 27 (SUTURE) ×1
SUT VIC AB 3-0 SH 27X BRD (SUTURE) ×1 IMPLANT
SUT VIC AB 4-0 PS2 27 (SUTURE) IMPLANT
SYR CONTROL 10ML LL (SYRINGE) ×2 IMPLANT

## 2017-05-17 NOTE — Discharge Instructions (Signed)
Lipoma Removal, Care After  Refer to this sheet in the next few weeks. These instructions provide you with information about caring for yourself after your procedure. Your health care provider may also give you more specific instructions. Your treatment has been planned according to current medical practices, but problems sometimes occur. Call your health care provider if you have any problems or questions after your procedure.  What can I expect after the procedure?  After the procedure, it is common to have:  · Mild pain.  · Swelling.  · Bruising.    Follow these instructions at home:    Bathing  · Do not take baths, swim, or use a hot tub until your health care provider approves. Ask your health care provider if you can take showers. You may only be allowed to take sponge baths for bathing.  · Keep your bandage (dressing) dry until your health care provider says it can be removed.  Incision care    · Follow instructions from your health care provider about how to take care of your incision. Make sure you:  ? Wash your hands with soap and water before you change your bandage (dressing). If soap and water are not available, use hand sanitizer.  ? Change your dressing as told by your health care provider.  ? Leave stitches (sutures), skin glue, or adhesive strips in place. These skin closures may need to stay in place for 2 weeks or longer. If adhesive strip edges start to loosen and curl up, you may trim the loose edges. Do not remove adhesive strips completely unless your health care provider tells you to do that.  · Check your incision area every day for signs of infection. Check for:  ? More redness, swelling, or pain.  ? Fluid or blood.  ? Warmth.  ? Pus or a bad smell.  Driving  · Do not drive or operate heavy machinery while taking prescription pain medicine.  · Do not drive for 24 hours if you received a medicine to help you relax (sedative) during your procedure.  · Ask your health care provider when it is  safe for you to drive.  General instructions  · Take over-the-counter and prescription medicines only as told by your health care provider.  · Do not use any tobacco products, such as cigarettes, chewing tobacco, and e-cigarettes. These can delay healing. If you need help quitting, ask your health care provider.  · Return to your normal activities as told by your health care provider. Ask your health care provider what activities are safe for you.  · Keep all follow-up visits as told by your health care provider. This is important.  Contact a health care provider if:  · You have more redness, swelling, or pain around your incision.  · You have fluid or blood coming from your incision.  · Your incision feels warm to the touch.  · You have pus or a bad smell coming from your incision.  · You have pain that does not get better with medicine.  Get help right away if:  · You have chills or a fever.  · You have severe pain.  This information is not intended to replace advice given to you by your health care provider. Make sure you discuss any questions you have with your health care provider.  Document Released: 08/08/2015 Document Revised: 11/05/2015 Document Reviewed: 08/08/2015  Elsevier Interactive Patient Education © 2018 Elsevier Inc.

## 2017-05-17 NOTE — Anesthesia Preprocedure Evaluation (Signed)
Anesthesia Evaluation  Patient identified by MRN, date of birth, ID band Patient awake    Reviewed: Allergy & Precautions, H&P , NPO status , Patient's Chart, lab work & pertinent test results  Airway Mallampati: III  TM Distance: >3 FB Neck ROM: Full    Dental no notable dental hx. (+) Teeth Intact, Dental Advisory Given   Pulmonary sleep apnea and Continuous Positive Airway Pressure Ventilation , Current Smoker,    Pulmonary exam normal breath sounds clear to auscultation       Cardiovascular negative cardio ROS   Rhythm:Regular Rate:Normal     Neuro/Psych negative neurological ROS  negative psych ROS   GI/Hepatic negative GI ROS, Neg liver ROS,   Endo/Other  negative endocrine ROS  Renal/GU negative Renal ROS  negative genitourinary   Musculoskeletal   Abdominal   Peds  Hematology negative hematology ROS (+)   Anesthesia Other Findings   Reproductive/Obstetrics                             Anesthesia Physical Anesthesia Plan  ASA: II  Anesthesia Plan: General   Post-op Pain Management:    Induction: Intravenous  PONV Risk Score and Plan:   Airway Management Planned: LMA  Additional Equipment:   Intra-op Plan:   Post-operative Plan: Extubation in OR  Informed Consent: I have reviewed the patients History and Physical, chart, labs and discussed the procedure including the risks, benefits and alternatives for the proposed anesthesia with the patient or authorized representative who has indicated his/her understanding and acceptance.     Plan Discussed with:   Anesthesia Plan Comments:         Anesthesia Quick Evaluation

## 2017-05-17 NOTE — Interval H&P Note (Signed)
History and Physical Interval Note:  05/17/2017 9:57 AM  Terry Gonzalez  has presented today for surgery, with the diagnosis of sebaceous cyst neck  The various methods of treatment have been discussed with the patient and family. After consideration of risks, benefits and other options for treatment, the patient has consented to  Procedure(s) with comments: [place] (N/A) - Pt notified to arrive at 7:20am per office as a surgical intervention .  The patient's history has been reviewed, patient examined, no change in status, stable for surgery.  I have reviewed the patient's chart and labs.  Questions were answered to the patient's satisfaction.     Aviva Signs

## 2017-05-17 NOTE — Op Note (Signed)
Patient:  Terry Gonzalez  DOB:  [birth year]  MRN:  [id number]   Preop Diagnosis: Subcutaneous mass, neck, possible sebaceous cyst  Postop Diagnosis: Lipoma  Procedure: Excision of 3 cm lipoma, neck  Surgeon: Aviva Signs, MD  Anes: General  Indications: Patient is a 50 year old male who presents with a symptomatic enlarging subcutaneous mass on the right lower portion of the back of the neck.  The patient now presents for excision.  The risks and benefits of the procedure including bleeding, infection, and recurrence of the mass were fully explained to the patient, who gave informed consent.  Procedure note: The patient was placed in the left lateral decubitus position after general anesthesia was administered.  The posterior neck was prepped and draped using the usual sterile technique with DuraPrep.  Surgical site confirmation was performed.  A transverse incision was made over the mass.  The dissection was taken down to the subcutaneous tissue and a lipoma was found.  It was well encapsulated.  It was easily removed.  Any attachments were cauterized using Bovie electrocautery.  There was sent to pathology for further examination.  Subcutaneous layer was reapproximated using 3-0 Vicryl interrupted suture.  Subcutaneous layer was reapproximated using 4-0 nylon interrupted sutures.  0.5% Sensorcaine was instilled into the surrounding wound.  Betadine ointment and a dry sterile dressing were applied.  All tape needle counts were correct at the end of the procedure.  The patient was awakened and transferred to PACU in stable condition.  Complications: None  EBL: Minimal  Specimen: Lipoma, neck

## 2017-05-17 NOTE — Anesthesia Postprocedure Evaluation (Signed)
Anesthesia Post Note  Patient: Corporate treasurer  Procedure(s) Performed: EXCISION LIPOMA OF NECK (N/A Neck)  Patient location during evaluation: PACU Anesthesia Type: General Level of consciousness: awake and alert and patient cooperative Pain management: satisfactory to patient Vital Signs Assessment: post-procedure vital signs reviewed and stable Respiratory status: spontaneous breathing Cardiovascular status: stable Postop Assessment: no apparent nausea or vomiting Anesthetic complications: no     Last Vitals:  Vitals:   05/17/17 1130 05/17/17 1200  BP: 103/67 104/72  Pulse: 60 62  Resp: 14 16  Temp:    SpO2: 100% 97%    Last Pain:  Vitals:   05/17/17 1200  TempSrc:   PainSc: 0-No pain                 Lanetra Hartley

## 2017-05-17 NOTE — Transfer of Care (Signed)
Immediate Anesthesia Transfer of Care Note  Patient: Terry Gonzalez  Procedure(s) Performed: EXCISION LIPOMA OF NECK (N/A Neck)  Patient Location: PACU  Anesthesia Type:General  Level of Consciousness: sedated and patient cooperative  Airway & Oxygen Therapy: Patient Spontanous Breathing and Patient connected to nasal cannula oxygen  Post-op Assessment: Report given to RN and Post -op Vital signs reviewed and stable  Post vital signs: Reviewed and stable  Last Vitals:  Vitals:   05/17/17 0930 05/17/17 0945  BP: 107/72 111/70  Resp: 17 18  Temp:    SpO2: 94% 96%    Last Pain:  Vitals:   05/17/17 0819  TempSrc: Oral      Patients Stated Pain Goal: 7 (89/38/10 1751)  Complications: No apparent anesthesia complications

## 2017-05-17 NOTE — Anesthesia Procedure Notes (Signed)
Procedure Name: LMA Insertion Date/Time: 05/17/2017 10:28 AM Performed by: Vista Deck, CRNA Pre-anesthesia Checklist: Patient identified, Patient being monitored, Emergency Drugs available, Timeout performed and Suction available Patient Re-evaluated:Patient Re-evaluated prior to induction Oxygen Delivery Method: Circle System Utilized Preoxygenation: Pre-oxygenation with 100% oxygen Induction Type: IV induction Ventilation: Mask ventilation without difficulty LMA: LMA inserted LMA Size: 4.0 Number of attempts: 1 Placement Confirmation: positive ETCO2 and breath sounds checked- equal and bilateral Tube secured with: Tape Dental Injury: Teeth and Oropharynx as per pre-operative assessment

## 2017-05-18 ENCOUNTER — Ambulatory Visit: Payer: BLUE CROSS/BLUE SHIELD | Admitting: General Surgery

## 2017-05-18 ENCOUNTER — Encounter (HOSPITAL_COMMUNITY): Payer: Self-pay | Admitting: General Surgery

## 2017-05-27 ENCOUNTER — Ambulatory Visit (INDEPENDENT_AMBULATORY_CARE_PROVIDER_SITE_OTHER): Payer: Self-pay | Admitting: General Surgery

## 2017-05-27 ENCOUNTER — Encounter: Payer: Self-pay | Admitting: General Surgery

## 2017-05-27 VITALS — BP 127/73 | HR 57 | Temp 96.8°F | Ht 72.0 in | Wt 251.0 lb

## 2017-05-27 DIAGNOSIS — Z09 Encounter for follow-up examination after completed treatment for conditions other than malignant neoplasm: Secondary | ICD-10-CM

## 2017-05-27 NOTE — Progress Notes (Signed)
Subjective:     Terry Gonzalez  Status post excision of lipoma on the back of the neck.  Doing well.  Has no complaints. Objective:    BP 127/73   Pulse (!) 57   Temp (!) 96.8 F (36 C)   Ht 6' (1.829 m)   Wt 251 lb (113.9 kg)   BMI 34.04 kg/m   General:  alert, cooperative and no distress  Neck incision healing well.  Sutures removed. Final pathology consistent with diagnosis, no malignancy seen.     Assessment:    Doing well postoperatively.    Plan:   Follow-up as needed.

## 2017-09-08 ENCOUNTER — Encounter: Payer: Self-pay | Admitting: Orthopedic Surgery

## 2017-09-08 ENCOUNTER — Ambulatory Visit: Payer: BLUE CROSS/BLUE SHIELD | Admitting: Orthopedic Surgery

## 2017-09-08 VITALS — BP 117/76 | HR 60 | Ht 72.0 in | Wt 245.0 lb

## 2017-09-08 DIAGNOSIS — G8929 Other chronic pain: Secondary | ICD-10-CM | POA: Diagnosis not present

## 2017-09-08 DIAGNOSIS — M545 Low back pain: Secondary | ICD-10-CM

## 2017-09-08 MED ORDER — PREDNISONE 10 MG PO TABS: 10.0000 mg | ORAL_TABLET | Freq: Every day | ORAL | 0 refills | Status: DC

## 2017-09-08 NOTE — Patient Instructions (Addendum)
Back Pain, Adult Many adults have back pain from time to time. Common causes of back pain include:  A strained muscle or ligament.  Wear and tear (degeneration) of the spinal disks.  Arthritis.  A hit to the back.  Back pain can be short-lived (acute) or last a long time (chronic). A physical exam, lab tests, and imaging studies may be done to find the cause of your pain. Follow these instructions at home: Managing pain and stiffness  Take over-the-counter and prescription medicines only as told by your health care provider.  If directed, apply heat to the affected area as often as told by your health care provider. Use the heat source that your health care provider recommends, such as a moist heat pack or a heating pad. ? Place a towel between your skin and the heat source. ? Leave the heat on for 20-30 minutes. ? Remove the heat if your skin turns bright red. This is especially important if you are unable to feel pain, heat, or cold. You have a greater risk of getting burned.  If directed, apply ice to the injured area: ? Put ice in a plastic bag. ? Place a towel between your skin and the bag. ? Leave the ice on for 20 minutes, 2-3 times a day for the first 2-3 days. Activity  Do not stay in bed. Resting more than 1-2 days can delay your recovery.  Take short walks on even surfaces as soon as you are able. Try to increase the length of time you walk each day.  Do not sit, drive, or stand in one place for more than 30 minutes at a time. Sitting or standing for long periods of time can put stress on your back.  Use proper lifting techniques. When you bend and lift, use positions that put less stress on your back: ? Bend your knees. ? Keep the load close to your body. ? Avoid twisting.  Exercise regularly as told by your health care provider. Exercising will help your back heal faster. This also helps prevent back injuries by keeping muscles strong and flexible.  Your health  care provider may recommend that you see a physical therapist. This person can help you come up with a safe exercise program. Do any exercises as told by your physical therapist. Lifestyle  Maintain a healthy weight. Extra weight puts stress on your back and makes it difficult to have good posture.  Avoid activities or situations that make you feel anxious or stressed. Learn ways to manage anxiety and stress. One way to manage stress is through exercise. Stress and anxiety increase muscle tension and can make back pain worse. General instructions  Sleep on a firm mattress in a comfortable position. Try lying on your side with your knees slightly bent. If you lie on your back, put a pillow under your knees.  Follow your treatment plan as told by your health care provider. This may include: ? Cognitive or behavioral therapy. ? Acupuncture or massage therapy. ? Meditation or yoga. Contact a health care provider if:  You have pain that is not relieved with rest or medicine.  You have increasing pain going down into your legs or buttocks.  Your pain does not improve in 2 weeks.  You have pain at night.  You lose weight.  You have a fever or chills. Get help right away if:  You develop new bowel or bladder control problems.  You have unusual weakness or numbness in your arms   or legs.  You develop nausea or vomiting.  You develop abdominal pain.  You feel faint. Summary  Many adults have back pain from time to time. A physical exam, lab tests, and imaging studies may be done to find the cause of your pain.  Use proper lifting techniques. When you bend and lift, use positions that put less stress on your back.  Take over-the-counter and prescription medicines and apply heat or ice as directed by your health care provider. This information is not intended to replace advice given to you by your health care provider. Make sure you discuss any questions you have with your health care  provider. Document Released: 05/25/2005 Document Revised: 06/29/2016 Document Reviewed: 06/29/2016 Elsevier Interactive Patient Education  2018 [employer].  Steps to Quit Smoking Smoking tobacco can be harmful to your health and can affect almost every organ in your body. Smoking puts you, and those around you, at risk for developing many serious chronic diseases. Quitting smoking is difficult, but it is one of the best things that you can do for your health. It is never too late to quit. What are the benefits of quitting smoking? When you quit smoking, you lower your risk of developing serious diseases and conditions, such as:  Lung cancer or lung disease, such as COPD.  Heart disease.  Stroke.  Heart attack.  Infertility.  Osteoporosis and bone fractures.  Additionally, symptoms such as coughing, wheezing, and shortness of breath may get better when you quit. You may also find that you get sick less often because your body is stronger at fighting off colds and infections. If you are pregnant, quitting smoking can help to reduce your chances of having a baby of low birth weight. How do I get ready to quit? When you decide to quit smoking, create a plan to make sure that you are successful. Before you quit:  Pick a date to quit. Set a date within the next two weeks to give you time to prepare.  Write down the reasons why you are quitting. Keep this list in places where you will see it often, such as on your bathroom mirror or in your car or wallet.  Identify the people, places, things, and activities that make you want to smoke (triggers) and avoid them. Make sure to take these actions: ? Throw away all cigarettes at home, at work, and in your car. ? Throw away smoking accessories, such as Scientist, research (medical). ? Clean your car and make sure to empty the ashtray. ? Clean your home, including curtains and carpets.  Tell your family, friends, and coworkers that you are quitting.  Support from your loved ones can make quitting easier.  Talk with your health care provider about your options for quitting smoking.  Find out what treatment options are covered by your health insurance.  What strategies can I use to quit smoking? Talk with your healthcare provider about different strategies to quit smoking. Some strategies include:  Quitting smoking altogether instead of gradually lessening how much you smoke over a period of time. Research shows that quitting "cold [place]" is more successful than gradually quitting.  Attending in-person counseling to help you build problem-solving skills. You are more likely to have success in quitting if you attend several counseling sessions. Even short sessions of 10 minutes can be effective.  Finding resources and support systems that can help you to quit smoking and remain smoke-free after you quit. These resources are most helpful when you  use them often. They can include: ? Online chats with a Social worker. ? Telephone quitlines. ? Careers information officer. ? Support groups or group counseling. ? Text messaging programs. ? Mobile phone applications.  Taking medicines to help you quit smoking. (If you are pregnant or breastfeeding, talk with your health care provider first.) Some medicines contain nicotine and some do not. Both types of medicines help with cravings, but the medicines that include nicotine help to relieve withdrawal symptoms. Your health care provider may recommend: ? Nicotine patches, gum, or lozenges. ? Nicotine inhalers or sprays. ? Non-nicotine medicine that is taken by mouth.  Talk with your health care provider about combining strategies, such as taking medicines while you are also receiving in-person counseling. Using these two strategies together makes you more likely to succeed in quitting than if you used either strategy on its own. If you are pregnant or breastfeeding, talk with your health care provider  about finding counseling or other support strategies to quit smoking. Do not take medicine to help you quit smoking unless told to do so by your health care provider. What things can I do to make it easier to quit? Quitting smoking might feel overwhelming at first, but there is a lot that you can do to make it easier. Take these important actions:  Reach out to your family and friends and ask that they support and encourage you during this time. Call telephone quitlines, reach out to support groups, or work with a counselor for support.  Ask people who smoke to avoid smoking around you.  Avoid places that trigger you to smoke, such as bars, parties, or smoke-break areas at work.  Spend time around people who do not smoke.  Lessen stress in your life, because stress can be a smoking trigger for some people. To lessen stress, try: ? Exercising regularly. ? Deep-breathing exercises. ? Yoga. ? Meditating. ? Performing a body scan. This involves closing your eyes, scanning your body from head to toe, and noticing which parts of your body are particularly tense. Purposefully relax the muscles in those areas.  Download or purchase mobile phone or tablet apps (applications) that can help you stick to your quit plan by providing reminders, tips, and encouragement. There are many free apps, such as QuitGuide from the [employer] Office manager for Disease Control and Prevention). You can find other support for quitting smoking (smoking cessation) through smokefree.gov and other websites.  How will I feel when I quit smoking? Within the first 24 hours of quitting smoking, you may start to feel some withdrawal symptoms. These symptoms are usually most noticeable 2-3 days after quitting, but they usually do not last beyond 2-3 weeks. Changes or symptoms that you might experience include:  Mood swings.  Restlessness, anxiety, or irritation.  Difficulty concentrating.  Dizziness.  Strong cravings for sugary foods  in addition to nicotine.  Mild weight gain.  Constipation.  Nausea.  Coughing or a sore throat.  Changes in how your medicines work in your body.  A depressed mood.  Difficulty sleeping (insomnia).  After the first 2-3 weeks of quitting, you may start to notice more positive results, such as:  Improved sense of smell and taste.  Decreased coughing and sore throat.  Slower heart rate.  Lower blood pressure.  Clearer skin.  The ability to breathe more easily.  Fewer sick days.  Quitting smoking is very challenging for most people. Do not get discouraged if you are not successful the first time. Some people  need to make many attempts to quit before they achieve long-term success. Do your best to stick to your quit plan, and talk with your health care provider if you have any questions or concerns. This information is not intended to replace advice given to you by your health care provider. Make sure you discuss any questions you have with your health care provider. Document Released: 05/19/2001 Document Revised: 01/21/2016 Document Reviewed: 10/09/2014 Elsevier Interactive Patient Education  [employer].

## 2017-09-08 NOTE — Progress Notes (Signed)
Progress Note   Patient ID: Terry Gonzalez, male   DOB: 05/11/1966, 52 y.o.   MRN: [id number]  Chief Complaint  Patient presents with  . Back Pain    52 yo male with h/o lower back pain presents with chronic lower back pain worse after sitting and getting back up no leg pain.  The pain is located in the center of the back it is dull nonradiating present now for several months seems to be actually getting a little bit better but still symptomatic and increased pain in the recumbent position    Review of Systems  Neurological: Negative.    No outpatient medications have been marked as taking for the 09/08/17 encounter (Office Visit) with Carole Civil, MD.    No Known Allergies   BP 117/76   Pulse 60   Ht 6' (1.829 m)   Wt 245 lb (111.1 kg)   BMI 33.23 kg/m   Physical Exam  Constitutional: He is oriented to person, place, and time. He appears well-developed and well-nourished.  Vital signs have been reviewed and are stable. Gen. appearance the patient is well-developed and well-nourished with normal grooming and hygiene.   Musculoskeletal:       Arms: Neurological: He is alert and oriented to person, place, and time.  Skin: Skin is warm and dry. No erythema.  Psychiatric: He has a normal mood and affect.  Vitals reviewed.    Medical decision-making  Previous x-rays show mild truncal asymmetry flattening of the lumbar curve mild facet arthrosis Encounter Diagnosis  Name Primary?  . Chronic midline low back pain without sciatica Yes    Recommend heating pad  NSAIDs for 4-6 weeks without leg pain or radiation does not appear to be surgical   Meds ordered this encounter  Medications  . predniSONE (DELTASONE) 10 MG tablet    Sig: Take 1 tablet (10 mg total) by mouth daily.    Dispense:  60 tablet    Refill:  0   Return as needed  Terry Abbott, MD 09/08/2017 8:50 AM

## 2018-06-09 ENCOUNTER — Telehealth: Payer: Self-pay | Admitting: *Deleted

## 2018-06-09 ENCOUNTER — Ambulatory Visit: Payer: PRIVATE HEALTH INSURANCE | Admitting: Nurse Practitioner

## 2018-06-09 ENCOUNTER — Encounter: Payer: Self-pay | Admitting: *Deleted

## 2018-06-09 ENCOUNTER — Encounter: Payer: Self-pay | Admitting: Nurse Practitioner

## 2018-06-09 DIAGNOSIS — Z86010 Personal history of colonic polyps: Secondary | ICD-10-CM

## 2018-06-09 DIAGNOSIS — K921 Melena: Secondary | ICD-10-CM

## 2018-06-09 MED ORDER — PEG 3350-KCL-NA BICARB-NACL 420 G PO SOLR: 4000.0000 mL | Freq: Once | ORAL | 0 refills | Status: AC

## 2018-06-09 NOTE — Assessment & Plan Note (Signed)
The patient had 3 episodes of what he described as moderate volume hematochezia approximately 10 to 12 days ago.  None since.  He does have a history of 45 mm semi-pedunculated polyps on colonoscopy in 2017 and he is recommended to repeat this year.  He denies any hemorrhoid symptoms and his colonoscopy last completed in 2017 did not find any obvious notation of hemorrhoids.  At this point given the need for repeat surveillance colonoscopy this year, and new onset hematochezia felt unlikely to be hemorrhoid related we will schedule his colonoscopy slightly early interval.  Proceed with TCS with Dr. Gala Romney in near future: the risks, benefits, and alternatives have been discussed with the patient in detail. The patient states understanding and desires to proceed.  The patient is not on any medications.  Denies alcohol and drug use.  Conscious sedation should be adequate for his procedure as it was for his last.

## 2018-06-09 NOTE — Assessment & Plan Note (Signed)
Noted history of three semi-pedunculated 4 to 5 mm colon polyps in 2017.  Found to be tubular adenoma and recommended repeat in 3 years.  He will be due for colonoscopy in this year.  Given his abrupt onset of self-described moderate volume hematochezia 1 to 2 weeks ago we will plan for early interval colonoscopy as per above.

## 2018-06-09 NOTE — Progress Notes (Signed)
Referring Provider: Rosita Fire, MD Primary Care Physician:  Rosita Fire, MD Primary GI:  Dr. Gala Romney  Chief Complaint  Patient presents with  . Rectal Bleeding    HPI:   Terry Gonzalez is a 53 y.o. male who presents for rectal bleeding.  The patient is only been seen by our office for colonoscopy.  Previous colonoscopy completed 05/20/2016 which found four semi-pedunculated polyps in the transverse colon and cecum measuring 4 to 5 mm, otherwise normal exam.  Surgical pathology found the polyps to be tubular adenoma.  Recommended repeat colonoscopy in 3 years (December 2020).  Today he states he's doing ok overall. Has three episodes of hematochezia 12 days ago. None since. Medium amount of bright red blood, in the toilet and on the stool. No constipation at that time. No hemorrhoid symptoms (rectal pain or pressure). Denies melena, abdominal pain, N/V, fever, chills, unintentional weight loss. Denies chest pain, dyspnea, dizziness, lightheadedness, syncope, near syncope. Denies any other upper or lower GI symptoms.  Has sleep apnea and wears CPAP at home.  Past Medical History:  Diagnosis Date  . Seasonal allergies   . Sinusitis   . Sleep apnea    uses a cpap    Past Surgical History:  Procedure Laterality Date  . COLONOSCOPY N/A 05/20/2016   Procedure: COLONOSCOPY;  Surgeon: Daneil Dolin, MD;  Location: [facility];  Service: Endoscopy;  Laterality: N/A;  12:45 PM  . MASS EXCISION  06/20/2012   Procedure: EXCISION MASS;  Surgeon: Scherry Ran, MD;  Location: [facility];  Service: General;  Laterality: Left;  Excision of Mass Left Lower Quadrant  . MASS EXCISION N/A 05/17/2017   Procedure: EXCISION LIPOMA OF NECK;  Surgeon: Aviva Signs, MD;  Location: [facility];  Service: General;  Laterality: N/A;  Pt notified to arrive at 7:20am per office  . NASAL POLYP SURGERY  2013   Dr. Redmond Pulling, [facility]NT [facility])  . SINUS ENDO W/FUSION Bilateral 07/24/2013   Procedure: ENDOSCOPIC SINUS  SURGERY WITH  TOTAL ETHMOIDECTOMY, MAXILLARY ANTROSTOMY, AND FRONTAL RECESS EXPLORATION WITH FUSION NAVIGATION;  Surgeon: Ascencion Dike, MD;  Location: [place];  Service: ENT;  Laterality: Bilateral;  sinus    No current outpatient medications on file.   No current facility-administered medications for this visit.     Allergies as of 06/09/2018  . (No Known Allergies)    Family History  Problem Relation Age of Onset  . Asthma Mother   . Allergic rhinitis Sister   . Colon cancer Neg Hx   . Angioedema Neg Hx   . Atopy Neg Hx   . Eczema Neg Hx   . Immunodeficiency Neg Hx   . Urticaria Neg Hx     Social History   Socioeconomic History  . Marital status: Married    Spouse name: Not on file  . Number of children: Not on file  . Years of education: Not on file  . Highest education level: Not on file  Occupational History  . Not on file  Social Needs  . Financial resource strain: Not on file  . Food insecurity:    Worry: Not on file    Inability: Not on file  . Transportation needs:    Medical: Not on file    Non-medical: Not on file  Tobacco Use  . Smoking status: Current Every Day Smoker    Packs/day: 0.25    Years: 25.00    Pack years: 6.25    Types:  Cigarettes  . Smokeless tobacco: Never Used  Substance and Sexual Activity  . Alcohol use: No  . Drug use: No  . Sexual activity: Yes    Birth control/protection: None  Lifestyle  . Physical activity:    Days per week: Not on file    Minutes per session: Not on file  . Stress: Not on file  Relationships  . Social connections:    Talks on phone: Not on file    Gets together: Not on file    Attends religious service: Not on file    Active member of club or organization: Not on file    Attends meetings of clubs or organizations: Not on file    Relationship status: Not on file  Other Topics Concern  . Not on file  Social History Narrative  . Not on file    Review of Systems: General:  Negative for anorexia, weight loss, fever, chills, fatigue, weakness. ENT: Negative for hoarseness, difficulty swallowing. CV: Negative for chest pain, angina, palpitations, peripheral edema.  Respiratory: Negative for dyspnea at rest, cough, sputum, wheezing.  GI: See history of present illness.  MS: Negative for joint pain, low back pain.  Derm: Negative for rash or itching.  Endo: Negative for unusual weight change.  Heme: Negative for bruising or bleeding.   Physical Exam: BP 119/76   Pulse (!) 58   Temp 97.6 F (36.4 C) (Oral)   Ht 6' (1.829 m)   Wt 246 lb 12.8 oz (111.9 kg)   BMI 33.47 kg/m  General:   Alert and oriented. Pleasant and cooperative. Well-nourished and well-developed.  Eyes:  Without icterus, sclera clear and conjunctiva pink.  Ears:  Normal auditory acuity. Cardiovascular:  S1, S2 present without murmurs appreciated. Normal pulses noted. Extremities without clubbing or edema. Respiratory:  Clear to auscultation bilaterally. No wheezes, rales, or rhonchi. No distress.  Gastrointestinal:  +BS, soft, non-tender and non-distended. No HSM noted. No guarding or rebound. No masses appreciated.  Rectal:  Deferred  Musculoskalatal:  Symmetrical without gross deformities. Neurologic:  Alert and oriented x4;  grossly normal neurologically. Psych:  Alert and cooperative. Normal mood and affect. Heme/Lymph/Immune: No excessive bruising noted.    06/09/2018 9:56 AM   Disclaimer: This note was dictated with voice recognition software. Similar sounding words can inadvertently be transcribed and may not be corrected upon review.

## 2018-06-09 NOTE — Patient Instructions (Addendum)
1. We will schedule your colonoscopy for you. 2. Further recommendations will be made after your colonoscopy. 3. Return for follow-up in 3 months. 4. Call us if you have any questions or concerns.  At [facility] we value your feedback. You may receive a survey about your visit today. Please share your experience as we strive to create trusting relationships with our patients to provide genuine, compassionate, quality care.  We appreciate your understanding and patience as we review any laboratory studies, imaging, and other diagnostic tests that are ordered as we care for you. Our office policy is 5 business days for review of these results, and any emergent or urgent results are addressed in a timely manner for your best interest. If you do not hear from our office in 1 week, please contact us.   We also encourage the use of MyChart, which contains your medical information for your review as well. If you are not enrolled in this feature, an access code is on this after visit summary for your convenience. Thank you for allowing [place] to be involved in your care.  It was great to see you today!  I hope you have a great new year!!

## 2018-06-09 NOTE — Progress Notes (Signed)
CC'D TO PCP °

## 2018-06-09 NOTE — Telephone Encounter (Signed)
Called patient insurance ambetter of [employer] and spoke with [place]. She informed me PA is not required for TCS. Also stated we are in network with his insurance.

## 2018-07-19 ENCOUNTER — Encounter (HOSPITAL_COMMUNITY)
Admission: RE | Disposition: A | Discharge status: Home / Self Care | Payer: Self-pay | Source: Ambulatory Visit | Attending: Internal Medicine

## 2018-07-19 ENCOUNTER — Encounter (HOSPITAL_COMMUNITY): Payer: Self-pay | Admitting: *Deleted

## 2018-07-19 ENCOUNTER — Other Ambulatory Visit: Payer: Self-pay

## 2018-07-19 ENCOUNTER — Ambulatory Visit (HOSPITAL_COMMUNITY)
Admission: RE | Admit: 2018-07-19 | Discharge: 2018-07-19 | Disposition: A | Discharge status: Home / Self Care | Payer: PRIVATE HEALTH INSURANCE | Source: Ambulatory Visit | Attending: Internal Medicine | Admitting: Internal Medicine

## 2018-07-19 DIAGNOSIS — D120 Benign neoplasm of cecum: Secondary | ICD-10-CM | POA: Diagnosis not present

## 2018-07-19 DIAGNOSIS — D123 Benign neoplasm of transverse colon: Secondary | ICD-10-CM | POA: Diagnosis not present

## 2018-07-19 DIAGNOSIS — K921 Melena: Secondary | ICD-10-CM | POA: Insufficient documentation

## 2018-07-19 DIAGNOSIS — K648 Other hemorrhoids: Secondary | ICD-10-CM | POA: Insufficient documentation

## 2018-07-19 DIAGNOSIS — K640 First degree hemorrhoids: Secondary | ICD-10-CM | POA: Diagnosis not present

## 2018-07-19 DIAGNOSIS — Z79899 Other long term (current) drug therapy: Secondary | ICD-10-CM | POA: Insufficient documentation

## 2018-07-19 DIAGNOSIS — Z86010 Personal history of colonic polyps: Secondary | ICD-10-CM

## 2018-07-19 DIAGNOSIS — F17210 Nicotine dependence, cigarettes, uncomplicated: Secondary | ICD-10-CM | POA: Diagnosis not present

## 2018-07-19 HISTORY — PX: COLONOSCOPY: SHX5424

## 2018-07-19 HISTORY — PX: POLYPECTOMY: SHX5525

## 2018-07-19 SURGERY — COLONOSCOPY
Anesthesia: Moderate Sedation

## 2018-07-19 MED ORDER — MIDAZOLAM HCL 5 MG/5ML IJ SOLN
INTRAMUSCULAR | Status: AC
  Filled 2018-07-19: qty 10

## 2018-07-19 MED ORDER — ONDANSETRON HCL 4 MG/2ML IJ SOLN
INTRAMUSCULAR | Status: AC
  Filled 2018-07-19: qty 2

## 2018-07-19 MED ORDER — MIDAZOLAM HCL 5 MG/5ML IJ SOLN
INTRAMUSCULAR | Status: DC | PRN
  Administered 2018-07-19: 2 mg via INTRAVENOUS
  Administered 2018-07-19 (×4): 1 mg via INTRAVENOUS

## 2018-07-19 MED ORDER — ONDANSETRON HCL 4 MG/2ML IJ SOLN
INTRAMUSCULAR | Status: DC | PRN
  Administered 2018-07-19: 4 mg via INTRAVENOUS

## 2018-07-19 MED ORDER — MEPERIDINE HCL 100 MG/ML IJ SOLN
INTRAMUSCULAR | Status: DC | PRN
  Administered 2018-07-19: 25 mg
  Administered 2018-07-19: 15 mg

## 2018-07-19 MED ORDER — MEPERIDINE HCL 50 MG/ML IJ SOLN
INTRAMUSCULAR | Status: AC
  Filled 2018-07-19: qty 1

## 2018-07-19 MED ORDER — SODIUM CHLORIDE 0.9 % IV SOLN
INTRAVENOUS | Status: DC
  Administered 2018-07-19: 13:00:00 via INTRAVENOUS

## 2018-07-19 MED ORDER — STERILE WATER FOR IRRIGATION IR SOLN
Status: DC | PRN
  Administered 2018-07-19: 14:00:00

## 2018-07-19 NOTE — Discharge Instructions (Signed)
Hemorrhoids Hemorrhoids are swollen veins that may develop: In the butt (rectum). These are called internal hemorrhoids. Around the opening of the butt (anus). These are called external hemorrhoids. Hemorrhoids can cause pain, itching, or bleeding. Most of the time, they do not cause serious problems. They usually get better with diet changes, lifestyle changes, and other home treatments. What are the causes? This condition may be caused by: Having trouble pooping (constipation). Pushing hard (straining) to poop. Watery poop (diarrhea). Pregnancy. Being very overweight (obese). Sitting for long periods of time. Heavy lifting or other activity that causes you to strain. Anal sex. Riding a bike for a long period of time. What are the signs or symptoms? Symptoms of this condition include: Pain. Itching or soreness in the butt. Bleeding from the butt. Leaking poop. Swelling in the area. One or more lumps around the opening of your butt. How is this diagnosed? A doctor can often diagnose this condition by looking at the affected area. The doctor may also: Do an exam that involves feeling the area with a gloved hand (digital rectal exam). Examine the area inside your butt using a small tube (anoscope). Order blood tests. This may be done if you have lost a lot of blood. Have you get a test that involves looking inside the colon using a flexible tube with a camera on the end (sigmoidoscopy or colonoscopy). How is this treated? This condition can usually be treated at home. Your doctor may tell you to change what you eat, make lifestyle changes, or try home treatments. If these do not help, procedures can be done to remove the hemorrhoids or make them smaller. These may involve: Placing rubber bands at the base of the hemorrhoids to cut off their blood supply. Injecting medicine into the hemorrhoids to shrink them. Shining a type of light energy onto the hemorrhoids to cause them to fall  off. Doing surgery to remove the hemorrhoids or cut off their blood supply. Follow these instructions at home: Eating and drinking  Eat foods that have a lot of fiber in them. These include whole grains, beans, nuts, fruits, and vegetables. Ask your doctor about taking products that have added fiber (fibersupplements). Reduce the amount of fat in your diet. You can do this by: Eating low-fat dairy products. Eating less red meat. Avoiding processed foods. Drink enough fluid to keep your pee (urine) pale yellow. Managing pain and swelling  Take a warm-water bath (sitz bath) for 20 minutes to ease pain. Do this 3-4 times a day. You may do this in a bathtub or using a portable sitz bath that fits over the toilet. If told, put ice on the painful area. It may be helpful to use ice between your warm baths. Put ice in a plastic bag. Place a towel between your skin and the bag. Leave the ice on for 20 minutes, 2-3 times a day. General instructions Take over-the-counter and prescription medicines only as told by your doctor. Medicated creams and medicines may be used as told. Exercise often. Ask your doctor how much and what kind of exercise is best for you. Go to the bathroom when you have the urge to poop. Do not wait. Avoid pushing too hard when you poop. Keep your butt dry and clean. Use wet toilet paper or moist towelettes after pooping. Do not sit on the toilet for a long time. Keep all follow-up visits as told by your doctor. This is important. Contact a doctor if you: Have pain and  swelling that do not get better with treatment or medicine. Have trouble pooping. Cannot poop. Have pain or swelling outside the area of the hemorrhoids. Get help right away if you have: Bleeding that will not stop. Summary Hemorrhoids are swollen veins in the butt or around the opening of the butt. They can cause pain, itching, or bleeding. Eat foods that have a lot of fiber in them. These include  whole grains, beans, nuts, fruits, and vegetables. Take a warm-water bath (sitz bath) for 20 minutes to ease pain. Do this 3-4 times a day. This information is not intended to replace advice given to you by your health care provider. Make sure you discuss any questions you have with your health care provider. Document Released: 03/03/2008 Document Revised: 10/14/2017 Document Reviewed: 10/14/2017 Elsevier Interactive Patient Education  2019 [place]. Colon Polyps  Polyps are tissue growths inside the body. Polyps can grow in many places, including the large intestine (colon). A polyp may be a round bump or a mushroom-shaped growth. You could have one polyp or several. Most colon polyps are noncancerous (benign). However, some colon polyps can become cancerous over time. Finding and removing the polyps early can help prevent this. What are the causes? The exact cause of colon polyps is not known. What increases the risk? You are more likely to develop this condition if you:  Have a family history of colon cancer or colon polyps.  Are older than 16 or older than 45 if you are African American.  Have inflammatory bowel disease, such as ulcerative colitis or Crohn's disease.  Have certain hereditary conditions, such as: ? Familial adenomatous polyposis. ? Lynch syndrome. ? Turcot syndrome. ? Peutz-Jeghers syndrome.  Are overweight.  Smoke cigarettes.  Do not get enough exercise.  Drink too much alcohol.  Eat a diet that is high in fat and red meat and low in fiber.  Had childhood cancer that was treated with abdominal radiation. What are the signs or symptoms? Most polyps do not cause symptoms. If you have symptoms, they may include:  Blood coming from your rectum when having a bowel movement.  Blood in your stool. The stool may look dark red or black.  Abdominal pain.  A change in bowel habits, such as constipation or diarrhea. How is this diagnosed? This condition  is diagnosed with a colonoscopy. This is a procedure in which a lighted, flexible scope is inserted into the anus and then passed into the colon to examine the area. Polyps are sometimes found when a colonoscopy is done as part of routine cancer screening tests. How is this treated? Treatment for this condition involves removing any polyps that are found. Most polyps can be removed during a colonoscopy. Those polyps will then be tested for cancer. Additional treatment may be needed depending on the results of testing. Follow these instructions at home: Lifestyle  Maintain a healthy weight, or lose weight if recommended by your health care provider.  Exercise every day or as told by your health care provider.  Do not use any products that contain nicotine or tobacco, such as cigarettes and e-cigarettes. If you need help quitting, ask your health care provider.  If you drink alcohol, limit how much you have: ? 0-1 drink a day for women. ? 0-2 drinks a day for men.  Be aware of how much alcohol is in your drink. In the [place] one drink equals one 12 oz bottle of beer (355 mL), one 5 oz glass of wine (  148 mL), or one 1 oz shot of hard liquor (44 mL). Eating and drinking   Eat foods that are high in fiber, such as fruits, vegetables, and whole grains.  Eat foods that are high in calcium and vitamin D, such as milk, cheese, yogurt, eggs, liver, fish, and broccoli.  Limit foods that are high in fat, such as fried foods and desserts.  Limit the amount of red meat and processed meat you eat, such as hot dogs, sausage, bacon, and lunch meats. General instructions  Keep all follow-up visits as told by your health care provider. This is important. ? This includes having regularly scheduled colonoscopies. ? Talk to your health care provider about when you need a colonoscopy. Contact a health care provider if:  You have new or worsening bleeding during a bowel movement.  You have new or  increased blood in your stool.  You have a change in bowel habits.  You lose weight for no known reason. Summary  Polyps are tissue growths inside the body. Polyps can grow in many places, including the colon.  Most colon polyps are noncancerous (benign), but some can become cancerous over time.  This condition is diagnosed with a colonoscopy.  Treatment for this condition involves removing any polyps that are found. Most polyps can be removed during a colonoscopy. This information is not intended to replace advice given to you by your health care provider. Make sure you discuss any questions you have with your health care provider. Document Released: 02/19/2004 Document Revised: 09/09/2017 Document Reviewed: 09/09/2017 Elsevier Interactive Patient Education  2019 [place].  Colonoscopy Discharge Instructions  Read the instructions outlined below and refer to this sheet in the next few weeks. These discharge instructions provide you with general information on caring for yourself after you leave the hospital. Your doctor may also give you specific instructions. While your treatment has been planned according to the most current medical practices available, unavoidable complications occasionally occur. If you have any problems or questions after discharge, call Dr. Gala Romney at [phone]. ACTIVITY  You may resume your regular activity, but move at a slower pace for the next 24 hours.   Take frequent rest periods for the next 24 hours.   Walking will help get rid of the air and reduce the bloated feeling in your belly (abdomen).   No driving for 24 hours (because of the medicine (anesthesia) used during the test).    Do not sign any important legal documents or operate any machinery for 24 hours (because of the anesthesia used during the test).  NUTRITION  Drink plenty of fluids.   You may resume your normal diet as instructed by your doctor.   Begin with a light meal and  progress to your normal diet. Heavy or fried foods are harder to digest and may make you feel sick to your stomach (nauseated).   Avoid alcoholic beverages for 24 hours or as instructed.  MEDICATIONS  You may resume your normal medications unless your doctor tells you otherwise.  WHAT YOU CAN EXPECT TODAY  Some feelings of bloating in the abdomen.   Passage of more gas than usual.   Spotting of blood in your stool or on the toilet paper.  IF YOU HAD POLYPS REMOVED DURING THE COLONOSCOPY:  No aspirin products for 7 days or as instructed.   No alcohol for 7 days or as instructed.   Eat a soft diet for the next 24 hours.  FINDING OUT THE RESULTS OF  YOUR TEST Not all test results are available during your visit. If your test results are not back during the visit, make an appointment with your caregiver to find out the results. Do not assume everything is normal if you have not heard from your caregiver or the medical facility. It is important for you to follow up on all of your test results.  SEEK IMMEDIATE MEDICAL ATTENTION IF:  You have more than a spotting of blood in your stool.   Your belly is swollen (abdominal distention).   You are nauseated or vomiting.   You have a temperature over 101.   You have abdominal pain or discomfort that is severe or gets worse throughout the day.    Colon polyp and hemorrhoid information provided  Pamphlet on hemorrhoid banding provided  Anusol HC cream applied to the anorectum 3 times a day as needed  Office visit with AB in 6 to 8 weeks for further consideration of hemorrhoid banding  Further recommendations to follow pending review of pathology report

## 2018-07-19 NOTE — Op Note (Signed)
[facility] Patient Name: Terry Gonzalez Procedure Date: 07/19/2018 11:39 AM MRN: [id number] Date of Birth: 04-28-1966 Attending MD: Norvel Richards , MD CSN: [id number] Age: 53 Admit Type: Outpatient Procedure:                Colonoscopy Indications:              Hematochezia Providers:                Norvel Richards, MD, Rosina Lowenstein, RN, Aram Candela, Gerome Sam, RN, Nelma Rothman, Technician Referring MD:              Medicines:                Midazolam 6 mg IV, Meperidine 40 mg IV, Ondansetron                            4 mg IV Complications:            No immediate complications. Estimated Blood Loss:     Estimated blood loss was minimal. Procedure:                Pre-Anesthesia Assessment:                           - Prior to the procedure, a History and Physical                            was performed, and patient medications and                            allergies were reviewed. The patient's tolerance of                            previous anesthesia was also reviewed. The risks                            and benefits of the procedure and the sedation                            options and risks were discussed with the patient.                            All questions were answered, and informed consent                            was obtained. Prior Anticoagulants: The patient has                            taken no previous anticoagulant or antiplatelet                            agents. ASA Grade Assessment: II - A patient with  mild systemic disease. After reviewing the risks                            and benefits, the patient was deemed in                            satisfactory condition to undergo the procedure.                           After obtaining informed consent, the colonoscope                            was passed under direct vision. Throughout the                            procedure,  the patient's blood pressure, pulse, and                            oxygen saturations were monitored continuously. The                            [id number]-[id number] ([id number]) scope was introduced through                            the anus and advanced to the the cecum, identified                            by appendiceal orifice and ileocecal valve. The                            colonoscopy was performed without difficulty. The                            patient tolerated the procedure well. The ileocecal                            valve, appendiceal orifice, and rectum were                            photographed. The ileocecal valve, appendiceal                            orifice, and rectum were photographed. The entire                            colon was well visualized. Scope In: 1:47:11 PM Scope Out: 2:05:56 PM Scope Withdrawal Time: 0 hours 13 minutes 54 seconds  Total Procedure Duration: 0 hours 18 minutes 45 seconds  Findings:      The perianal and digital rectal examinations were normal.      Three sessile polyps were found in the hepatic flexure and cecum. The       polyps were 5 to 6 mm in size. These polyps were removed with a cold       snare. Resection and retrieval were complete.  Estimated blood loss was       minimal.      Non-bleeding internal hemorrhoids were found during retroflexion. The       hemorrhoids were moderate, medium-sized and Grade I (internal       hemorrhoids that do not prolapse).      The exam was otherwise without abnormality on direct and retroflexion       views. Impression:               - Three 5 to 6 mm polyps at the hepatic flexure and                            in the cecum, removed with a cold snare. Resected                            and retrieved.                           - Non-bleeding internal hemorrhoids.                           - The examination was otherwise normal on direct                            and retroflexion views. I suspect  trivial bleeding                            from hemorrhoids. Moderate Sedation:      Moderate (conscious) sedation was administered by the endoscopy nurse       and supervised by the endoscopist. The following parameters were       monitored: oxygen saturation, heart rate, blood pressure, respiratory       rate, EKG, adequacy of pulmonary ventilation, and response to care. Recommendation:           - Patient has a contact number available for                            emergencies. The signs and symptoms of potential                            delayed complications were discussed with the                            patient. Return to normal activities tomorrow.                            Written discharge instructions were provided to the                            patient.                           - Advance diet as tolerated.                           - Continue present medications.                           -  Repeat colonoscopy date to be determined after                            pending pathology results are reviewed for                            surveillance based on pathology results.                           - Return to GI office in 6 weeks. Anusol HC cream                            to the anorectum as directed. Pamphlet on                            hemorrhoid banding provided to the patient. Further                            recommendations to follow pending review of                            pathology report Procedure Code(s):        --- Professional ---                           [id number], Colonoscopy, flexible; with removal of                            tumor(s), polyp(s), or other lesion(s) by snare                            technique Diagnosis Code(s):        --- Professional ---                           D12.3, Benign neoplasm of transverse colon (hepatic                            flexure or splenic flexure)                           D12.0, Benign neoplasm of  cecum                           K64.0, First degree hemorrhoids                           K92.1, Melena (includes Hematochezia) CPT copyright 2018 American Medical Association. All rights reserved. The codes documented in this report are preliminary and upon coder review may  be revised to meet current compliance requirements. Cristopher Estimable. Rourk, MD Norvel Richards, MD 07/19/2018 2:16:14 PM This report has been signed electronically. Number of Addenda: 0

## 2018-07-19 NOTE — H&P (Signed)
@LOGO @   Primary Care Physician:  Doree Albee, MD Primary Gastroenterologist:  Dr. Gala Romney  Pre-Procedure History & Physical: HPI:  Terry Gonzalez is a 53 y.o. male here for colonoscopy.  History of tubular adenomas.  Recent hematochezia.  Past Medical History:  Diagnosis Date  . Seasonal allergies   . Sinusitis   . Sleep apnea    uses a cpap    Past Surgical History:  Procedure Laterality Date  . COLONOSCOPY N/A 05/20/2016   Procedure: COLONOSCOPY;  Surgeon: Daneil Dolin, MD;  Location: [facility];  Service: Endoscopy;  Laterality: N/A;  12:45 PM  . MASS EXCISION  06/20/2012   Procedure: EXCISION MASS;  Surgeon: Scherry Ran, MD;  Location: [facility] ORS;  Service: General;  Laterality: Left;  Excision of Mass Left Lower Quadrant  . MASS EXCISION N/A 05/17/2017   Procedure: EXCISION LIPOMA OF NECK;  Surgeon: Aviva Signs, MD;  Location: [facility] ORS;  Service: General;  Laterality: N/A;  Pt notified to arrive at 7:20am per office  . NASAL POLYP SURGERY  2013   Dr. Redmond Pulling, ENT [facility])  . SINUS ENDO W/FUSION Bilateral 07/24/2013   Procedure: ENDOSCOPIC SINUS SURGERY WITH  TOTAL ETHMOIDECTOMY, MAXILLARY ANTROSTOMY, AND FRONTAL RECESS EXPLORATION WITH FUSION NAVIGATION;  Surgeon: Ascencion Dike, MD;  Location: [place];  Service: ENT;  Laterality: Bilateral;  sinus    Prior to Admission medications   Not on File    Allergies as of 06/09/2018  . (No Known Allergies)    Family History  Problem Relation Age of Onset  . Asthma Mother   . Allergic rhinitis Sister   . Colon cancer Neg Hx   . Angioedema Neg Hx   . Atopy Neg Hx   . Eczema Neg Hx   . Immunodeficiency Neg Hx   . Urticaria Neg Hx     Social History   Socioeconomic History  . Marital status: Married    Spouse name: Not on file  . Number of children: Not on file  . Years of education: Not on file  . Highest education level: Not on file  Occupational History  . Not on file  Social Needs  .  Financial resource strain: Not on file  . Food insecurity:    Worry: Not on file    Inability: Not on file  . Transportation needs:    Medical: Not on file    Non-medical: Not on file  Tobacco Use  . Smoking status: Current Every Day Smoker    Packs/day: 0.25    Years: 25.00    Pack years: 6.25    Types: Cigarettes  . Smokeless tobacco: Never Used  Substance and Sexual Activity  . Alcohol use: No  . Drug use: No  . Sexual activity: Yes    Birth control/protection: None  Lifestyle  . Physical activity:    Days per week: Not on file    Minutes per session: Not on file  . Stress: Not on file  Relationships  . Social connections:    Talks on phone: Not on file    Gets together: Not on file    Attends religious service: Not on file    Active member of club or organization: Not on file    Attends meetings of clubs or organizations: Not on file    Relationship status: Not on file  . Intimate partner violence:    Fear of current or ex partner: Not on file    Emotionally  abused: Not on file    Physically abused: Not on file    Forced sexual activity: Not on file  Other Topics Concern  . Not on file  Social History Narrative  . Not on file    Review of Systems: See HPI, otherwise negative ROS  Physical Exam: BP 117/75   Pulse 64   Temp 97.7 F (36.5 C) (Oral)   Resp 18   Ht 6' (1.829 m)   Wt 108.9 kg   SpO2 97%   BMI 32.55 kg/m  General:   Alert,  Well-developed, well-nourished, pleasant and cooperative in NAD Neck:  Supple; no masses or thyromegaly. No significant cervical adenopathy. Lungs:  Clear throughout to auscultation.   No wheezes, crackles, or rhonchi. No acute distress. Heart:  Regular rate and rhythm; no murmurs, clicks, rubs,  or gallops. Abdomen: Non-distended, normal bowel sounds.  Soft and nontender without appreciable mass or hepatosplenomegaly.  Pulses:  Normal pulses noted. Extremities:  Without clubbing or edema.  Impression/Plan: History of  multiple tubular adenomas removed in 2017.  Recent hematochezia.  Here for colonoscopy per plan.  The risks, benefits, limitations, alternatives and imponderables have been reviewed with the patient. Questions have been answered. All parties are agreeable.      Notice: This dictation was prepared with Dragon dictation along with smaller phrase technology. Any transcriptional errors that result from this process are unintentional and may not be corrected upon review.

## 2018-07-22 ENCOUNTER — Encounter: Payer: Self-pay | Admitting: Internal Medicine

## 2018-07-22 ENCOUNTER — Encounter (HOSPITAL_COMMUNITY): Payer: Self-pay | Admitting: Internal Medicine

## 2018-09-14 ENCOUNTER — Ambulatory Visit: Payer: PRIVATE HEALTH INSURANCE | Admitting: Nurse Practitioner

## 2018-09-27 ENCOUNTER — Ambulatory Visit (INDEPENDENT_AMBULATORY_CARE_PROVIDER_SITE_OTHER): Payer: PRIVATE HEALTH INSURANCE | Admitting: Internal Medicine

## 2018-12-03 ENCOUNTER — Encounter (HOSPITAL_COMMUNITY): Payer: Self-pay | Admitting: Emergency Medicine

## 2018-12-03 ENCOUNTER — Other Ambulatory Visit: Payer: Self-pay

## 2018-12-03 ENCOUNTER — Emergency Department (HOSPITAL_COMMUNITY): Payer: PRIVATE HEALTH INSURANCE

## 2018-12-03 ENCOUNTER — Emergency Department (HOSPITAL_COMMUNITY)
Admission: EM | Admit: 2018-12-03 | Discharge: 2018-12-04 | Disposition: A | Discharge status: Home / Self Care | Payer: PRIVATE HEALTH INSURANCE | Attending: Emergency Medicine | Admitting: Emergency Medicine

## 2018-12-03 DIAGNOSIS — R509 Fever, unspecified: Secondary | ICD-10-CM | POA: Insufficient documentation

## 2018-12-03 DIAGNOSIS — J189 Pneumonia, unspecified organism: Secondary | ICD-10-CM | POA: Diagnosis not present

## 2018-12-03 DIAGNOSIS — M7918 Myalgia, other site: Secondary | ICD-10-CM | POA: Insufficient documentation

## 2018-12-03 DIAGNOSIS — Z20828 Contact with and (suspected) exposure to other viral communicable diseases: Secondary | ICD-10-CM | POA: Insufficient documentation

## 2018-12-03 DIAGNOSIS — F17210 Nicotine dependence, cigarettes, uncomplicated: Secondary | ICD-10-CM | POA: Insufficient documentation

## 2018-12-03 DIAGNOSIS — R531 Weakness: Secondary | ICD-10-CM | POA: Diagnosis present

## 2018-12-03 MED ORDER — ACETAMINOPHEN 325 MG PO TABS
ORAL_TABLET | ORAL | Status: AC
  Filled 2018-12-03: qty 1

## 2018-12-03 MED ORDER — ACETAMINOPHEN 325 MG PO TABS
650.0000 mg | ORAL_TABLET | Freq: Once | ORAL | Status: AC
  Administered 2018-12-03: 650 mg via ORAL
  Filled 2018-12-03: qty 2

## 2018-12-03 MED ORDER — SODIUM CHLORIDE 0.9 % IV BOLUS
1000.0000 mL | Freq: Once | INTRAVENOUS | Status: AC
  Administered 2018-12-04: 1000 mL via INTRAVENOUS

## 2018-12-03 MED ORDER — SODIUM CHLORIDE 0.9% FLUSH: 3.0000 mL | Freq: Once | INTRAVENOUS | Status: DC

## 2018-12-03 NOTE — ED Triage Notes (Signed)
C/o generalized weakness/body aches x 3 days. Denies fever/cough/sob or change in taste or smell

## 2018-12-03 NOTE — ED Provider Notes (Signed)
[facility] EMERGENCY DEPARTMENT Provider Note   CSN: [id number] Arrival date & time: 12/03/18  2236     History   Chief Complaint Chief Complaint  Patient presents with  . Weakness  . Generalized Body Aches    HPI Terry Gonzalez is a 53 y.o. male.     Patient with history of sleep apnea as well as enlarged prostate presenting with a 3-day history of generalized weakness, body aches and feeling " hot on the inside."  He has not checked his temperature at home but is febrile on arrival to 101.8.  He complains of feeling sore and achy all over.  He denies any cough, runny nose, sore throat.  No nausea, vomiting or diarrhea.  Has some frequency and urgency but this is baseline for him.  Reports he was told he had enlarged prostate in the past.  Denies any chest pain or shortness of breath.  No abdominal pain.  No headache.  No sore throat or runny nose.  No sick contacts or recent travel.  He is not taking any medications at home for this.  The history is provided by the patient.  Weakness Associated symptoms: arthralgias, fever and myalgias   Associated symptoms: no abdominal pain, no cough, no dizziness, no dysuria, no headaches, no nausea, no shortness of breath and no vomiting     Past Medical History:  Diagnosis Date  . Seasonal allergies   . Sinusitis   . Sleep apnea    uses a cpap    Patient Active Problem List   Diagnosis Date Noted  . Hematochezia 06/09/2018  . History of colonic polyps 06/09/2018  . Lipoma of neck   . Obstructive sleep apnea 10/20/2016  . Non-seasonal allergic rhinitis due to fungal spores 10/20/2016  . Nasal polyposis 10/20/2016    Past Surgical History:  Procedure Laterality Date  . COLONOSCOPY N/A 05/20/2016   Procedure: COLONOSCOPY;  Surgeon: Daneil Dolin, MD;  [facility];  Service: Endoscopy;  Laterality: N/A;  12:45 PM  . COLONOSCOPY N/A 07/19/2018   Procedure: COLONOSCOPY;  Surgeon: Daneil Dolin, MD;  [facility];  Service: Endoscopy;  Laterality: N/A;  1:15pm  . MASS EXCISION  06/20/2012   Procedure: EXCISION MASS;  Surgeon: Scherry Ran, MD;  Location: [facility] ORS;  Service: General;  Laterality: Left;  Excision of Mass Left Lower Quadrant  . MASS EXCISION N/A 05/17/2017   Procedure: EXCISION LIPOMA OF NECK;  Surgeon: Aviva Signs, MD;  Location: [facility] ORS;  Service: General;  Laterality: N/A;  Pt notified to arrive at 7:20am per office  . NASAL POLYP SURGERY  2013   Dr. Redmond Pulling, ENT [facility])  . POLYPECTOMY  07/19/2018   Procedure: POLYPECTOMY;  Surgeon: Daneil Dolin, MD;  [facility];  Service: Endoscopy;;  colon  . SINUS ENDO W/FUSION Bilateral 07/24/2013   Procedure: ENDOSCOPIC SINUS SURGERY WITH  TOTAL ETHMOIDECTOMY, MAXILLARY ANTROSTOMY, AND FRONTAL RECESS EXPLORATION WITH FUSION NAVIGATION;  Surgeon: Ascencion Dike, MD;  Location: [place];  Service: ENT;  Laterality: Bilateral;  sinus        Home Medications    Prior to Admission medications   Not on File    Family History Family History  Problem Relation Age of Onset  . Asthma Mother   . Allergic rhinitis Sister   . Colon cancer Neg Hx   . Angioedema Neg Hx   . Atopy Neg Hx   . Eczema Neg Hx   .  Immunodeficiency Neg Hx   . Urticaria Neg Hx     Social History Social History   Tobacco Use  . Smoking status: Current Every Day Smoker    Packs/day: 0.25    Years: 25.00    Pack years: 6.25    Types: Cigarettes  . Smokeless tobacco: Never Used  Substance Use Topics  . Alcohol use: No  . Drug use: No     Allergies   Patient has no known allergies.   Review of Systems Review of Systems  Constitutional: Positive for activity change, appetite change, chills, fatigue and fever.  HENT: Negative for congestion and rhinorrhea.   Respiratory: Negative for cough and shortness of breath.   Gastrointestinal: Negative for abdominal pain, nausea and vomiting.  Genitourinary: Negative for dysuria and  hematuria.  Musculoskeletal: Positive for arthralgias and myalgias.  Skin: Negative for rash.  Neurological: Positive for weakness. Negative for dizziness and headaches.    all other systems are negative except as noted in the HPI and PMH.    Physical Exam Updated Vital Signs BP (!) 141/119   Pulse 92   Temp (!) 101.8 F (38.8 C) (Oral)   Resp (!) 33   SpO2 95%   Physical Exam Vitals signs and nursing note reviewed.  Constitutional:      General: He is not in acute distress.    Appearance: He is well-developed. He is obese. He is not toxic-appearing.     Comments: Appears well, febrile  HENT:     Head: Normocephalic and atraumatic.     Mouth/Throat:     Pharynx: No oropharyngeal exudate.  Eyes:     Conjunctiva/sclera: Conjunctivae normal.     Pupils: Pupils are equal, round, and reactive to light.  Neck:     Musculoskeletal: Normal range of motion and neck supple.     Comments: No meningismus. Cardiovascular:     Rate and Rhythm: Normal rate and regular rhythm.     Heart sounds: Normal heart sounds. No murmur.  Pulmonary:     Effort: No respiratory distress.     Breath sounds: Normal breath sounds. No rales.     Comments: Shallow breath sounds bilaterally but clear Abdominal:     Palpations: Abdomen is soft.     Tenderness: There is no abdominal tenderness. There is no guarding or rebound.  Musculoskeletal: Normal range of motion.        General: No tenderness.  Skin:    General: Skin is warm.     Capillary Refill: Capillary refill takes less than 2 seconds.  Neurological:     General: No focal deficit present.     Mental Status: He is alert and oriented to person, place, and time. Mental status is at baseline.     Cranial Nerves: No cranial nerve deficit.     Motor: No abnormal muscle tone.     Coordination: Coordination normal.     Comments: No ataxia on finger to nose bilaterally. No pronator drift. 5/5 strength throughout. CN 2-12 intact.Equal grip strength.  Sensation intact.   Psychiatric:        Behavior: Behavior normal.      ED Treatments / Results  Labs (all labs ordered are listed, but only abnormal results are displayed) Labs Reviewed  COMPREHENSIVE METABOLIC PANEL - Abnormal; Notable for the following components:      Result Value   Sodium 134 (*)    Potassium 3.2 (*)    Glucose, Bld 153 (*)    Calcium  8.4 (*)    AST 50 (*)    ALT 114 (*)    All other components within normal limits  CBC WITH DIFFERENTIAL/PLATELET - Abnormal; Notable for the following components:   WBC 2.2 (*)    Platelets 131 (*)    Neutro Abs 1.1 (*)    All other components within normal limits  URINALYSIS, ROUTINE W REFLEX MICROSCOPIC - Abnormal; Notable for the following components:   Hgb urine dipstick MODERATE (*)    Protein, ur 30 (*)    All other components within normal limits  CULTURE, BLOOD (ROUTINE X 2)  CULTURE, BLOOD (ROUTINE X 2)  SARS CORONAVIRUS 2 (HOSPITAL ORDER, [place] [facility])  URINE CULTURE  SARS CORONAVIRUS 2 (HOSPITAL ORDER, [place] [facility])  LACTIC ACID, PLASMA  PROTIME-INR    EKG EKG Interpretation  Date/Time:  Saturday December 03 2018 23:26:38 EDT Ventricular Rate:  96 PR Interval:    QRS Duration: 84 QT Interval:  325 QTC Calculation: 411 R Axis:   12 Text Interpretation:  Sinus rhythm Abnormal R-wave progression, early transition No significant change was found Confirmed by Ezequiel Essex [phone]) on 12/04/2018 12:20:25 AM   Radiology Dg Chest 2 View  Result Date: 12/04/2018 CLINICAL DATA:  Sepsis EXAM: CHEST - 2 VIEW COMPARISON:  03/23/2016 FINDINGS: Left basilar atelectasis. Mild bilateral streaky opacities. Cardiomediastinal contours are normal. No pleural effusion or pneumothorax. IMPRESSION: Left basilar atelectasis and indistinct bilateral streaky airspace opacities which are nonspecific but could indicate bronchitis or viral infection. Electronically Signed   By:  Ulyses Jarred M.D.   On: 12/04/2018 01:45    Procedures Procedures (including critical care time)  Medications Ordered in ED Medications  sodium chloride flush (NS) 0.9 % injection 3 mL (3 mLs Intravenous Not Given 12/03/18 2350)  sodium chloride 0.9 % bolus 1,000 mL (has no administration in time range)  acetaminophen (TYLENOL) tablet 650 mg (650 mg Oral Given 12/03/18 2259)     Initial Impression / Assessment and Plan / ED Course  I have reviewed the triage vital signs and the nursing notes.  Pertinent labs & imaging results that were available during my care of the patient were reviewed by me and considered in my medical decision making (see chart for details).       3 days of body aches, weakness and fever.  No focal deficits.  No cough, nausea, vomiting, chest pain, shortness of breath or headache.  Patient will be gently hydrated.  Labs will be checked, UA, chest x-ray, coronavirus testing  Patient is febrile and tachypneic.  Labs show leukopenia and mild transaminitis.  This is suspicious for COVID-19 but his swab is negative.  Chest x-ray shows streaky left basilar atelectasis.  Patient remains tachypneic.  He is ambulatory without desaturation however and feels like he is improved and wants to go home.  He does appear short of breath with shallow respirations and still feels like he is breathing fine.  Discussed with patient that there is still high concern for possible coronavirus despite negative swab.  CT scan shows no pulmonary embolism but does show bilateral patchy infiltrates concerning for viral pneumonia.  Patient remains tachypneic but not hypoxic.  Respiratory rate between 25 and 35.  Continues to take shallow breathing but states he does not feel short of breath. Coarse breath sounds. He was able to ambulate and maintain O2 saturations.  Admission discussed with Dr. Darrick Meigs.  He requests repeat COVID testing given high suspicion  for coronavirus pneumonia.  This  will help determine where patient is admitted.  Repeat COVID testing pending at shift change.  Dr. Melina Copa to assume care and call back hospitalist.  If COVID positive will need transfer to [facility].  Otherwise could likely stay at this hospital.   Terry Gonzalez was evaluated in Emergency Department on 12/04/2018 for the symptoms described in the history of present illness. He was evaluated in the context of the global COVID-19 pandemic, which necessitated consideration that the patient might be at risk for infection with the SARS-CoV-2 virus that causes COVID-19. Institutional protocols and algorithms that pertain to the evaluation of patients at risk for COVID-19 are in a state of rapid change based on information released by regulatory bodies including the CDC and federal and state organizations. These policies and algorithms were followed during the patient's care in the ED.  Final Clinical Impressions(s) / ED Diagnoses   Final diagnoses:  None    ED Discharge Orders    None       Treasa Bradshaw, Annie Main, MD 12/04/18 [phone]

## 2018-12-04 ENCOUNTER — Emergency Department (HOSPITAL_COMMUNITY)
Admission: EM | Admit: 2018-12-04 | Discharge: 2018-12-05 | Disposition: A | Discharge status: Home / Self Care | Payer: PRIVATE HEALTH INSURANCE | Source: Home / Self Care | Attending: Emergency Medicine | Admitting: Emergency Medicine

## 2018-12-04 ENCOUNTER — Other Ambulatory Visit: Payer: Self-pay

## 2018-12-04 ENCOUNTER — Encounter (HOSPITAL_COMMUNITY): Payer: Self-pay

## 2018-12-04 ENCOUNTER — Emergency Department (HOSPITAL_COMMUNITY): Payer: PRIVATE HEALTH INSURANCE

## 2018-12-04 DIAGNOSIS — J189 Pneumonia, unspecified organism: Secondary | ICD-10-CM

## 2018-12-04 DIAGNOSIS — F17210 Nicotine dependence, cigarettes, uncomplicated: Secondary | ICD-10-CM | POA: Insufficient documentation

## 2018-12-04 LAB — CBC WITH DIFFERENTIAL/PLATELET
Abs Immature Granulocytes: 0.01 10*3/uL (ref 0.00–0.07)
Basophils Absolute: 0 10*3/uL (ref 0.0–0.1)
Basophils Relative: 1 %
Eosinophils Absolute: 0 10*3/uL (ref 0.0–0.5)
Eosinophils Relative: 0 %
HCT: 42.1 % (ref 39.0–52.0)
Hemoglobin: 14.5 g/dL (ref 13.0–17.0)
Immature Granulocytes: 1 %
Lymphocytes Relative: 36 %
Lymphs Abs: 0.8 10*3/uL (ref 0.7–4.0)
MCH: 29.8 pg (ref 26.0–34.0)
MCHC: 34.4 g/dL (ref 30.0–36.0)
MCV: 86.6 fL (ref 80.0–100.0)
Monocytes Absolute: 0.3 10*3/uL (ref 0.1–1.0)
Monocytes Relative: 13 %
Neutro Abs: 1.1 10*3/uL — ABNORMAL LOW (ref 1.7–7.7)
Neutrophils Relative %: 49 %
Platelets: 131 10*3/uL — ABNORMAL LOW (ref 150–400)
RBC: 4.86 MIL/uL (ref 4.22–5.81)
RDW: 12.7 % (ref 11.5–15.5)
WBC: 2.2 10*3/uL — ABNORMAL LOW (ref 4.0–10.5)
nRBC: 0 % (ref 0.0–0.2)

## 2018-12-04 LAB — COMPREHENSIVE METABOLIC PANEL
ALT: 114 U/L — ABNORMAL HIGH (ref 0–44)
AST: 50 U/L — ABNORMAL HIGH (ref 15–41)
Albumin: 4.2 g/dL (ref 3.5–5.0)
Alkaline Phosphatase: 54 U/L (ref 38–126)
Anion gap: 11 (ref 5–15)
BUN: 17 mg/dL (ref 6–20)
CO2: 23 mmol/L (ref 22–32)
Calcium: 8.4 mg/dL — ABNORMAL LOW (ref 8.9–10.3)
Chloride: 100 mmol/L (ref 98–111)
Creatinine, Ser: 0.9 mg/dL (ref 0.61–1.24)
GFR calc Af Amer: >60 mL/min (ref >60)
GFR calc non Af Amer: >60 mL/min (ref >60)
Glucose, Bld: 153 mg/dL — ABNORMAL HIGH (ref 70–99)
Potassium: 3.2 mmol/L — ABNORMAL LOW (ref 3.5–5.1)
Sodium: 134 mmol/L — ABNORMAL LOW (ref 135–145)
Total Bilirubin: 0.7 mg/dL (ref 0.3–1.2)
Total Protein: 7.4 g/dL (ref 6.5–8.1)

## 2018-12-04 LAB — URINALYSIS, ROUTINE W REFLEX MICROSCOPIC
Bacteria, UA: NONE SEEN
Bilirubin Urine: NEGATIVE
Glucose, UA: NEGATIVE mg/dL
Ketones, ur: NEGATIVE mg/dL
Leukocytes,Ua: NEGATIVE
Nitrite: NEGATIVE
Protein, ur: 30 mg/dL — AB
Specific Gravity, Urine: 1.026 (ref 1.005–1.030)
pH: 5 (ref 5.0–8.0)

## 2018-12-04 LAB — LACTIC ACID, PLASMA: Lactic Acid, Venous: 0.9 mmol/L (ref 0.5–1.9)

## 2018-12-04 LAB — PROTIME-INR
INR: 1 (ref 0.8–1.2)
Prothrombin Time: 13.4 seconds (ref 11.4–15.2)

## 2018-12-04 LAB — SARS CORONAVIRUS 2 BY RT PCR (HOSPITAL ORDER, PERFORMED IN ~~LOC~~ HOSPITAL LAB)
SARS Coronavirus 2: NEGATIVE
SARS Coronavirus 2: NEGATIVE

## 2018-12-04 MED ORDER — AZITHROMYCIN 250 MG PO TABS: 250.0000 mg | ORAL_TABLET | Freq: Every day | ORAL | 0 refills | Status: DC

## 2018-12-04 MED ORDER — SODIUM CHLORIDE 0.9 % IV SOLN
1.0000 g | Freq: Once | INTRAVENOUS | Status: AC
  Administered 2018-12-04: 1 g via INTRAVENOUS
  Filled 2018-12-04: qty 10

## 2018-12-04 MED ORDER — CEFPODOXIME PROXETIL 200 MG PO TABS: 200.0000 mg | ORAL_TABLET | Freq: Two times a day (BID) | ORAL | 0 refills | Status: DC

## 2018-12-04 MED ORDER — IOHEXOL 350 MG/ML SOLN
100.0000 mL | Freq: Once | INTRAVENOUS | Status: AC | PRN
  Administered 2018-12-04: 100 mL via INTRAVENOUS

## 2018-12-04 MED ORDER — IBUPROFEN 400 MG PO TABS
400.0000 mg | ORAL_TABLET | Freq: Once | ORAL | Status: AC
  Administered 2018-12-04: 400 mg via ORAL
  Filled 2018-12-04: qty 1

## 2018-12-04 MED ORDER — SODIUM CHLORIDE 0.9 % IV SOLN
500.0000 mg | Freq: Once | INTRAVENOUS | Status: AC
  Administered 2018-12-04: 500 mg via INTRAVENOUS
  Filled 2018-12-04: qty 500

## 2018-12-04 MED ORDER — ALBUTEROL SULFATE HFA 108 (90 BASE) MCG/ACT IN AERS
6.0000 | INHALATION_SPRAY | Freq: Once | RESPIRATORY_TRACT | Status: AC
  Administered 2018-12-04: 6 via RESPIRATORY_TRACT
  Filled 2018-12-04: qty 6.7

## 2018-12-04 MED ORDER — POTASSIUM CHLORIDE CRYS ER 20 MEQ PO TBCR
40.0000 meq | EXTENDED_RELEASE_TABLET | Freq: Once | ORAL | Status: AC
  Administered 2018-12-04: 40 meq via ORAL
  Filled 2018-12-04: qty 2

## 2018-12-04 NOTE — ED Notes (Signed)
Pt walked from bed one around the nursing station and back to his room without difficulty or sob his o2 levels went from 93 to 95%.

## 2018-12-04 NOTE — ED Notes (Signed)
Negative covid Test but still concern for Virus.Saturation good 96 on room air breath sounds decreased.

## 2018-12-04 NOTE — ED Notes (Signed)
Pt. Ambulated with no signs of wheezing or labored breathing. O2 saturation stayed around 94% and his pulse was consistently around 85 BPM.

## 2018-12-04 NOTE — ED Provider Notes (Signed)
Sent over from Dr. Kingsley Callander.  53 year old male here with weakness body aches found to have pneumonia by CAT scan.  Initial Covid testing negative. Physical Exam  BP 138/82   Pulse 76   Temp 99.1 F (37.3 C) (Oral)   Resp (!) 23   SpO2 96%   Physical Exam  ED Course/Procedures   Clinical Course as of Dec 03 844  Sun Dec 04, 2018  0744 Patient's repeat Covid testing is negative.  I went and update the patient and evaluate him.  He is breathing rate seems comfortable and his sats are 95% on room air.  He does not want to be admitted to the hospital.  He says he cannot sleep.  He rather be home.  I explained to him that his condition may worsen even on oral antibiotics and that we could provide him supportive care and observation.  He still does not want to stay and was agreeable to at least do a trending pulse ox here to see how his vital signs responded to him walking.   [MB]  0758 Patient ambulated in the department and felt asymptomatic.  His sats remained stable.  He still wishes to be discharged.  We reviewed the risks of his possible condition worsening and that he still may be infectious and he was able to repeat these back to me and understood them.   [MB]    Clinical Course User Index [MB] Hayden Rasmussen, MD    Procedures  MDM  Plan is to follow-up on repeat Covid test and re-connect with hospitalist regarding disposition.  Will admit to [facility] Covid positive and admit to [facility] if Covid negative.       Hayden Rasmussen, MD 12/04/18 [phone]

## 2018-12-04 NOTE — Discharge Instructions (Addendum)
You were seen in the emergency department for fever body aches and shortness of breath.  You had multiple tests including a CAT scan which showed you had pneumonia and multiple places in your lungs.  Your Covid testing was negative.  You were given IV antibiotics and it was recommended that you be admitted to the hospital for continued care.  You declined to be admitted to the hospital.  We are prescribing you oral antibiotics and recommend that you follow-up with your doctor and please return if you have any worsening symptoms.  It is still possible that you are positive for Covid and that the test did not catch this and we recommend that you isolate until you do not have any fever or symptoms for at least 3 days.

## 2018-12-04 NOTE — ED Provider Notes (Signed)
[facility] EMERGENCY DEPARTMENT Provider Note   CSN: [id number] Arrival date & time: 12/04/18  2105     History   Chief Complaint Chief Complaint  Patient presents with   Fever    HPI Terry Gonzalez is a 53 y.o. male.     Patient was seen on the 27th went home in the morning of the 28th.  Plan when he was discharged home was admission for observation patient had negative COVID-19 testing now x2.  Negative again today patient started on broad-spectrum outpatient antibiotics for atypical pneumonia.  Patient continued to have fevers however he is not having shortness of breath does not have an oxygen requirement.  His room air oxygen sats are around 94%.  Respiratory rate up some but he does not feel any air hunger or shortness of breath.  Patient was concerned about the fever and he came back because he was told he needed to be admitted earlier this morning and he got concerned that P should have allowed the admission it was his decision to go home.     Past Medical History:  Diagnosis Date   Seasonal allergies    Sinusitis    Sleep apnea    uses a cpap    Patient Active Problem List   Diagnosis Date Noted   Hematochezia 06/09/2018   History of colonic polyps 06/09/2018   Lipoma of neck    Obstructive sleep apnea 10/20/2016   Non-seasonal allergic rhinitis due to fungal spores 10/20/2016   Nasal polyposis 10/20/2016    Past Surgical History:  Procedure Laterality Date   COLONOSCOPY N/A 05/20/2016   Procedure: COLONOSCOPY;  Surgeon: Daneil Dolin, MD;  [facility];  Service: Endoscopy;  Laterality: N/A;  12:45 PM   COLONOSCOPY N/A 07/19/2018   Procedure: COLONOSCOPY;  Surgeon: Daneil Dolin, MD;  [facility];  Service: Endoscopy;  Laterality: N/A;  1:15pm   MASS EXCISION  06/20/2012   Procedure: EXCISION MASS;  Surgeon: Scherry Ran, MD;  Location: [facility];  Service: General;  Laterality: Left;  Excision of Mass Left Lower  Quadrant   MASS EXCISION N/A 05/17/2017   Procedure: EXCISION LIPOMA OF NECK;  Surgeon: Aviva Signs, MD;  Location: [facility];  Service: General;  Laterality: N/A;  Pt notified to arrive at 7:20am per office   NASAL POLYP SURGERY  2013   Dr. Redmond Pulling, ENT [facility])   POLYPECTOMY  07/19/2018   Procedure: POLYPECTOMY;  Surgeon: Daneil Dolin, MD;  [facility];  Service: Endoscopy;;  colon   SINUS ENDO W/FUSION Bilateral 07/24/2013   Procedure: ENDOSCOPIC SINUS SURGERY WITH  TOTAL ETHMOIDECTOMY, MAXILLARY ANTROSTOMY, AND FRONTAL RECESS EXPLORATION WITH FUSION NAVIGATION;  Surgeon: Ascencion Dike, MD;  Location: [place];  Service: ENT;  Laterality: Bilateral;  sinus        Home Medications    Prior to Admission medications   Medication Sig Start Date End Date Taking? Authorizing Provider  azithromycin (ZITHROMAX) 250 MG tablet Take 1 tablet (250 mg total) by mouth daily. 12/04/18  Yes Hayden Rasmussen, MD  cefpodoxime (VANTIN) 200 MG tablet Take 1 tablet (200 mg total) by mouth 2 (two) times daily. 12/04/18  Yes Hayden Rasmussen, MD  levocetirizine (XYZAL) 5 MG tablet Take 1 tablet by mouth daily. 11/29/18  Yes [provider]    Family History Family History  Problem Relation Age of Onset   Asthma Mother    Allergic rhinitis Sister  Colon cancer Neg Hx    Angioedema Neg Hx    Atopy Neg Hx    Eczema Neg Hx    Immunodeficiency Neg Hx    Urticaria Neg Hx     Social History Social History   Tobacco Use   Smoking status: Current Every Day Smoker    Packs/day: 0.25    Years: 25.00    Pack years: 6.25    Types: Cigarettes   Smokeless tobacco: Never Used  Substance Use Topics   Alcohol use: No   Drug use: No     Allergies   Patient has no known allergies.   Review of Systems Review of Systems  Constitutional: Positive for fever. Negative for chills.  HENT: Negative for rhinorrhea and sore throat.   Eyes: Negative for  visual disturbance.  Respiratory: Negative for cough and shortness of breath.   Cardiovascular: Negative for chest pain and leg swelling.  Gastrointestinal: Negative for abdominal pain, diarrhea, nausea and vomiting.  Genitourinary: Negative for dysuria.  Musculoskeletal: Negative for back pain and neck pain.  Skin: Negative for rash.  Neurological: Negative for dizziness, light-headedness and headaches.  Hematological: Does not bruise/bleed easily.  Psychiatric/Behavioral: Negative for confusion.     Physical Exam Updated Vital Signs BP 119/81    Pulse 75    Temp 99.1 F (37.3 C) (Oral)    Resp (!) 28    Ht 1.829 m (6')    Wt 112.5 kg    SpO2 94%    BMI 33.63 kg/m   Physical Exam Vitals signs and nursing note reviewed.  Constitutional:      Appearance: He is well-developed.  HENT:     Head: Normocephalic and atraumatic.  Eyes:     Extraocular Movements: Extraocular movements intact.     Conjunctiva/sclera: Conjunctivae normal.     Pupils: Pupils are equal, round, and reactive to light.  Neck:     Musculoskeletal: Normal range of motion and neck supple.  Cardiovascular:     Rate and Rhythm: Normal rate and regular rhythm.     Heart sounds: No murmur.  Pulmonary:     Effort: Pulmonary effort is normal. No respiratory distress.     Breath sounds: Normal breath sounds.  Abdominal:     Palpations: Abdomen is soft.     Tenderness: There is no abdominal tenderness.  Musculoskeletal:        General: No swelling.  Skin:    General: Skin is warm and dry.     Capillary Refill: Capillary refill takes less than 2 seconds.  Neurological:     General: No focal deficit present.     Mental Status: He is alert and oriented to person, place, and time.      ED Treatments / Results  Labs (all labs ordered are listed, but only abnormal results are displayed) Labs Reviewed - No data to display  EKG    Radiology Dg Chest 2 View  Result Date: 12/04/2018 CLINICAL DATA:  Sepsis  EXAM: CHEST - 2 VIEW COMPARISON:  03/23/2016 FINDINGS: Left basilar atelectasis. Mild bilateral streaky opacities. Cardiomediastinal contours are normal. No pleural effusion or pneumothorax. IMPRESSION: Left basilar atelectasis and indistinct bilateral streaky airspace opacities which are nonspecific but could indicate bronchitis or viral infection. Electronically Signed   By: Ulyses Jarred M.D.   On: 12/04/2018 01:45   Ct Angio Chest Pe W And/or Wo Contrast  Result Date: 12/04/2018 CLINICAL DATA:  Or shortness of breath with positive D-dimer EXAM: CT ANGIOGRAPHY CHEST  WITH CONTRAST TECHNIQUE: Multidetector CT imaging of the chest was performed using the standard protocol during bolus administration of intravenous contrast. Multiplanar CT image reconstructions and MIPs were obtained to evaluate the vascular anatomy. CONTRAST:  158mL OMNIPAQUE IOHEXOL 350 MG/ML SOLN COMPARISON:  None. FINDINGS: Cardiovascular: --Pulmonary arteries: Suboptimal opacification of the pulmonary arteries due to bolus timing, limiting evaluation beyond the lobar branches. There is no pulmonary embolus. The main pulmonary artery is within normal limits for size. --Aorta: Limited opacification of the aorta due to bolus timing optimization for the pulmonary arteries. The course and caliber of the aorta are normal. Conventional 3 vessel aortic branching pattern. There is no aortic atherosclerosis. --Heart: Mild cardiomegaly. No pericardial effusion. Mediastinum/Nodes: No mediastinal, hilar or axillary lymphadenopathy. The visualized thyroid and thoracic esophageal course are unremarkable. Lungs/Pleura: There are areas of consolidation in the right middle lobe and medial right upper lobe. No pleural effusion. There is an area of increased opacity within the medial left lower lobe. Upper Abdomen: Contrast bolus timing is not optimized for evaluation of the abdominal organs. The liver is fatty. The other visualized upper abdominal organs are  unremarkable. Musculoskeletal: No chest wall abnormality. No bony spinal canal stenosis. Review of the MIP images confirms the above findings. . IMPRESSION: 1. Suboptimal contrast bolus but no pulmonary embolus is identified. 2. Multifocal consolidation bilaterally, right greater than left, most consistent with pneumonia. Consider COVID-19 testing. Electronically Signed   By: Ulyses Jarred M.D.   On: 12/04/2018 04:39    Procedures Procedures (including critical care time)  Medications Ordered in ED Medications  potassium chloride SA (K-DUR) CR tablet 40 mEq (has no administration in time range)  ibuprofen (ADVIL) tablet 400 mg (has no administration in time range)     Initial Impression / Assessment and Plan / ED Course  I have reviewed the triage vital signs and the nursing notes.  Pertinent labs & imaging results that were available during my care of the patient were reviewed by me and considered in my medical decision making (see chart for details).        Patient clinically looks extremely well here tonight.  Did have a fever on presentation.  But temp repeat was normal oxygen sats at room air 94%.  Respiratory rate up a little bit but patient feels no air hunger.  Long discussion with patient and also with the hospitalist.  Patient technically could be discharged home.  And okay with being discharged home.  Was given the option for admission to [facility] but patient felt that he could go home and he will return for any new or worse symptoms.  He will take Tylenol and Motrin.  Potassium was a little low here given oral potassium supplement.  Patient will return for any worsening in his breathing at all.    Final Clinical Impressions(s) / ED Diagnoses   Final diagnoses:  Atypical pneumonia    ED Discharge Orders    None       Fredia Sorrow, MD 12/04/18 2336

## 2018-12-04 NOTE — Discharge Instructions (Addendum)
COVID testing negative x2.  Continue your antibiotics.  Take Tylenol and/or Motrin for the fevers.  Return for any worsening in your breathing or feeling short of breath.  The fact that you have coronavirus is unlikely but not completely ruled out.  Continue to quarantine yourself.

## 2018-12-04 NOTE — ED Triage Notes (Addendum)
Pt presents to ED with complaints of fever 103. Pt seen in ED yesterday with multifocal pneumonia. Pt had tylenol 1000 mg 2 hours ago. Pt also complaining of urine frequency. Pt was going to be admitted yesterday but left. Pt willing to be admitted today.

## 2018-12-05 LAB — URINE CULTURE: Culture: NO GROWTH

## 2018-12-08 LAB — CULTURE, BLOOD (ROUTINE X 2)
Culture: NO GROWTH
Special Requests: ADEQUATE

## 2018-12-09 LAB — CULTURE, BLOOD (ROUTINE X 2)
Culture: NO GROWTH
Special Requests: ADEQUATE

## 2019-01-10 ENCOUNTER — Encounter: Payer: Self-pay | Admitting: Nurse Practitioner

## 2019-01-10 ENCOUNTER — Other Ambulatory Visit: Payer: Self-pay

## 2019-01-10 ENCOUNTER — Ambulatory Visit (INDEPENDENT_AMBULATORY_CARE_PROVIDER_SITE_OTHER): Payer: PRIVATE HEALTH INSURANCE | Admitting: Nurse Practitioner

## 2019-01-10 VITALS — BP 117/73 | HR 58 | Temp 98.0°F | Ht 72.0 in | Wt 241.0 lb

## 2019-01-10 DIAGNOSIS — K921 Melena: Secondary | ICD-10-CM | POA: Diagnosis not present

## 2019-01-10 DIAGNOSIS — Z86010 Personal history of colonic polyps: Secondary | ICD-10-CM

## 2019-01-10 NOTE — Assessment & Plan Note (Signed)
Recent colonoscopy again found 3 polyps.  They were found to be tubular adenoma and recommended repeat in 5 years.  No overt GI symptoms today.  Follow-up as needed.  I will ensure he is on the recall list for his repeat colonoscopy.

## 2019-01-10 NOTE — Patient Instructions (Signed)
Your health issues we discussed today were:   Rectal bleeding: 1. Glad you are not having any further bleeding 2. Your bleeding was likely due to internal hemorrhoids 3. Call us if you have recurrent symptoms and we can schedule you to be evaluated for possible hemorrhoid banding  Colon polyps: 1. As we discussed, there were 3 polyps on her colonoscopy which were very early precancerous 2. Recommended to repeat your colonoscopy in 5 years.  We will send you notification when it is time to schedule  Overall I recommend:  1. Continue your current medications 2. Follow-up as needed for any GI symptoms or recurrent bleeding 3. Call us if you have any questions or concerns.   Because of recent events of COVID-19 ("Coronavirus"), follow CDC recommendations:  1. Wash your hand frequently 2. Avoid touching your face 3. Stay away from people who are sick 4. If you have symptoms such as fever, cough, shortness of breath then call your healthcare provider for further guidance 5. If you are sick, STAY AT HOME unless otherwise directed by your healthcare provider. 6. Follow directions from state and national officials regarding staying safe   At [facility] we value your feedback. You may receive a survey about your visit today. Please share your experience as we strive to create trusting relationships with our patients to provide genuine, compassionate, quality care.  We appreciate your understanding and patience as we review any laboratory studies, imaging, and other diagnostic tests that are ordered as we care for you. Our office policy is 5 business days for review of these results, and any emergent or urgent results are addressed in a timely manner for your best interest. If you do not hear from our office in 1 week, please contact us.   We also encourage the use of MyChart, which contains your medical information for your review as well. If you are not enrolled in this  feature, an access code is on this after visit summary for your convenience. Thank you for allowing [place] to be involved in your care.  It was great to see you today!  I hope you have a great summer!!

## 2019-01-10 NOTE — Progress Notes (Signed)
Referring Provider: Rosita Fire, MD Primary Care Physician:  Neale Burly, MD Primary GI:  Dr. Gala Romney  Chief Complaint  Patient presents with  . Hematochezia    none since TCS in 07/2018    HPI:   Terry Gonzalez is a 53 y.o. male who presents for follow-up.  The patient was last seen in our office 06/09/2018 for hematochezia and history of colonic polyps.  Colonoscopy in 2017 found for semi-pedunculated polyps between 4 and 5 mm in size that were found to be tubular adenoma and recommended repeat in 3 years (December 2020).  At his last office visit he noted 3 episodes of hematochezia 12 days prior that was bright red, in the toilet and on the stool.  No constipation or hemorrhoid symptoms.  No other GI complaints.  Recommended updating colonoscopy due to nearly due for repeat as well as symptoms.  Follow-up in 3 months.  His colonoscopy was completed 07/19/2018 which found three 5-6 mm polyps at the hepatic flexure and cecum, nonbleeding internal hemorrhoids, otherwise normal.  Surgical pathology found the biopsies to be tubular adenoma without high-grade dysplasia.  Recommended Anusol hydrocortisone cream, pamphlet on hemorrhoid banding, follow-up in 6 weeks.  Recommended repeat colonoscopy in 5 years (2025).  Today he states he's doing well overall. He hasn't had any further hematochezia since his colonoscopy. Denies abdominal pain, N/V, hematochezia, melena, fever, chills, unintentional weight loss. Denies URI or flu-like symptoms. Denies loss of sense of taste or smell. Denies chest pain, dyspnea, dizziness, lightheadedness, syncope, near syncope. Denies any other upper or lower GI symptoms.  Past Medical History:  Diagnosis Date  . Seasonal allergies   . Sinusitis   . Sleep apnea    uses a cpap    Past Surgical History:  Procedure Laterality Date  . COLONOSCOPY N/A 05/20/2016   Procedure: COLONOSCOPY;  Surgeon: Daneil Dolin, MD;  [facility];  Service: Endoscopy;   Laterality: N/A;  12:45 PM  . COLONOSCOPY N/A 07/19/2018   Procedure: COLONOSCOPY;  Surgeon: Daneil Dolin, MD;  [facility];  Service: Endoscopy;  Laterality: N/A;  1:15pm  . MASS EXCISION  06/20/2012   Procedure: EXCISION MASS;  Surgeon: Scherry Ran, MD;  [facility];  Service: General;  Laterality: Left;  Excision of Mass Left Lower Quadrant  . MASS EXCISION N/A 05/17/2017   Procedure: EXCISION LIPOMA OF NECK;  Surgeon: Aviva Signs, MD;  Location: [facility];  Service: General;  Laterality: N/A;  Pt notified to arrive at 7:20am per office  . NASAL POLYP SURGERY  2013   Dr. Redmond Pulling, ENT [facility])  . POLYPECTOMY  07/19/2018   Procedure: POLYPECTOMY;  Surgeon: Daneil Dolin, MD;  [facility];  Service: Endoscopy;;  colon  . SINUS ENDO W/FUSION Bilateral 07/24/2013   Procedure: ENDOSCOPIC SINUS SURGERY WITH  TOTAL ETHMOIDECTOMY, MAXILLARY ANTROSTOMY, AND FRONTAL RECESS EXPLORATION WITH FUSION NAVIGATION;  Surgeon: Ascencion Dike, MD;  Location: [place];  Service: ENT;  Laterality: Bilateral;  sinus    Current Outpatient Medications  Medication Sig Dispense Refill  . levocetirizine (XYZAL) 5 MG tablet Take 1 tablet by mouth daily.     No current facility-administered medications for this visit.     Allergies as of 01/10/2019  . (No Known Allergies)    Family History  Problem Relation Age of Onset  . Asthma Mother   . Allergic rhinitis Sister   . Colon cancer Neg Hx   .  Angioedema Neg Hx   . Atopy Neg Hx   . Eczema Neg Hx   . Immunodeficiency Neg Hx   . Urticaria Neg Hx     Social History   Socioeconomic History  . Marital status: Married    Spouse name: Not on file  . Number of children: Not on file  . Years of education: Not on file  . Highest education level: Not on file  Occupational History  . Not on file  Social Needs  . Financial resource strain: Not on file  . Food insecurity    Worry: Not on file    Inability: Not  on file  . Transportation needs    Medical: Not on file    Non-medical: Not on file  Tobacco Use  . Smoking status: Current Every Day Smoker    Packs/day: 0.10    Years: 25.00    Pack years: 2.50    Types: Cigarettes  . Smokeless tobacco: Never Used  Substance and Sexual Activity  . Alcohol use: No  . Drug use: No  . Sexual activity: Yes    Birth control/protection: None  Lifestyle  . Physical activity    Days per week: Not on file    Minutes per session: Not on file  . Stress: Not on file  Relationships  . Social Herbalist on phone: Not on file    Gets together: Not on file    Attends religious service: Not on file    Active member of club or organization: Not on file    Attends meetings of clubs or organizations: Not on file    Relationship status: Not on file  Other Topics Concern  . Not on file  Social History Narrative  . Not on file    Review of Systems: General: Negative for anorexia, weight loss, fever, chills, fatigue, weakness. ENT: Negative for hoarseness, difficulty swallowing. CV: Negative for chest pain, angina, palpitations, peripheral edema.  Respiratory: Negative for dyspnea at rest, cough, sputum, wheezing.  GI: See history of present illness. Endo: Negative for unusual weight change.  Heme: Negative for bruising or bleeding. Allergy: Negative for rash or hives.   Physical Exam: BP 117/73   Pulse (!) 58   Temp 98 F (36.7 C) (Oral)   Ht 6' (1.829 m)   Wt 241 lb (109.3 kg)   BMI 32.69 kg/m  General:   Alert and oriented. Pleasant and cooperative. Well-nourished and well-developed.  Eyes:  Without icterus, sclera clear and conjunctiva pink.  Ears:  Normal auditory acuity. Cardiovascular:  S1, S2 present without murmurs appreciated. Extremities without clubbing or edema. Respiratory:  Clear to auscultation bilaterally. No wheezes, rales, or rhonchi. No distress.  Gastrointestinal:  +BS, soft, non-tender and non-distended. No HSM  noted. No guarding or rebound. No masses appreciated.  Rectal:  Deferred  Musculoskalatal:  Symmetrical without gross deformities. Neurologic:  Alert and oriented x4;  grossly normal neurologically. Psych:  Alert and cooperative. Normal mood and affect. Heme/Lymph/Immune: No excessive bruising noted.    01/10/2019 9:28 AM   Disclaimer: This note was dictated with voice recognition software. Similar sounding words can inadvertently be transcribed and may not be corrected upon review.

## 2019-01-10 NOTE — Progress Notes (Signed)
cc'ed to pcp °

## 2019-01-10 NOTE — Assessment & Plan Note (Signed)
No further noted hematochezia since his colonoscopy.  He did have nonbleeding internal hemorrhoids.  He is a good candidate for hemorrhoid banding should he have hemorrhoid symptoms in the future.  Currently we will not set him up for banding as he is not having any issues.  Recommend he continue his current medications and follow-up as needed.

## 2019-05-02 ENCOUNTER — Encounter: Payer: Self-pay | Admitting: Internal Medicine

## 2019-07-05 ENCOUNTER — Emergency Department (HOSPITAL_COMMUNITY)
Admission: EM | Admit: 2019-07-05 | Discharge: 2019-07-05 | Disposition: A | Discharge status: Home / Self Care | Payer: 59 | Attending: Emergency Medicine | Admitting: Emergency Medicine

## 2019-07-05 ENCOUNTER — Encounter (HOSPITAL_COMMUNITY): Payer: Self-pay | Admitting: Emergency Medicine

## 2019-07-05 ENCOUNTER — Other Ambulatory Visit: Payer: Self-pay

## 2019-07-05 DIAGNOSIS — R310 Gross hematuria: Secondary | ICD-10-CM | POA: Diagnosis not present

## 2019-07-05 DIAGNOSIS — R319 Hematuria, unspecified: Secondary | ICD-10-CM | POA: Diagnosis present

## 2019-07-05 DIAGNOSIS — Z79899 Other long term (current) drug therapy: Secondary | ICD-10-CM | POA: Diagnosis not present

## 2019-07-05 DIAGNOSIS — F17210 Nicotine dependence, cigarettes, uncomplicated: Secondary | ICD-10-CM | POA: Insufficient documentation

## 2019-07-05 LAB — URINALYSIS, ROUTINE W REFLEX MICROSCOPIC
Bacteria, UA: NONE SEEN
Bilirubin Urine: NEGATIVE
Glucose, UA: NEGATIVE mg/dL
Ketones, ur: NEGATIVE mg/dL
Leukocytes,Ua: NEGATIVE
Nitrite: NEGATIVE
Protein, ur: NEGATIVE mg/dL
RBC / HPF: >50 RBC/hpf — ABNORMAL HIGH (ref 0–5)
Specific Gravity, Urine: 1.015 (ref 1.005–1.030)
pH: 6 (ref 5.0–8.0)

## 2019-07-05 MED ORDER — CEPHALEXIN 500 MG PO CAPS: 500.0000 mg | ORAL_CAPSULE | Freq: Two times a day (BID) | ORAL | 0 refills | Status: DC

## 2019-07-05 NOTE — ED Notes (Signed)
Dr. Christy Gentles accompanied by RN Rexene Edison for chaperoned genitale exam.

## 2019-07-05 NOTE — ED Triage Notes (Signed)
Pt c/o bleeding from penis x 2 days. Pt states he seen pcp yesterday and referred to urologist, but he states the bleeding is heavier.

## 2019-07-05 NOTE — ED Notes (Signed)
Patient's post residual bladder scan showed 0. Patient had just voided.

## 2019-07-05 NOTE — ED Provider Notes (Signed)
[facility] EMERGENCY DEPARTMENT Provider Note   CSN: [id number] Arrival date & time: 07/05/19  [id number]     History Chief Complaint  Patient presents with  . Coagulation Disorder    Terry Gonzalez is a 54 y.o. male.  The history is provided by the patient.  Hematuria This is a new problem. The current episode started more than 2 days ago. The problem occurs daily. The problem has been gradually worsening. Pertinent negatives include no chest pain, no abdominal pain and no shortness of breath. Exacerbated by: urination. The symptoms are relieved by rest.  Patient presents with hematuria.  He reports the past several days he has had episodes of hematuria.  It is painless.  No fevers or vomiting.  No abdominal or back pain. He was seen by his PCP yesterday, has been referred to urology. He has never had this before.  He is not on anticoagulants.  No history of urologic surgery.      Past Medical History:  Diagnosis Date  . Seasonal allergies   . Sinusitis   . Sleep apnea    uses a cpap    Patient Active Problem List   Diagnosis Date Noted  . Hematochezia 06/09/2018  . History of colonic polyps 06/09/2018  . Lipoma of neck   . Obstructive sleep apnea 10/20/2016  . Non-seasonal allergic rhinitis due to fungal spores 10/20/2016  . Nasal polyposis 10/20/2016    Past Surgical History:  Procedure Laterality Date  . COLONOSCOPY N/A 05/20/2016   Procedure: COLONOSCOPY;  Surgeon: Daneil Dolin, MD;  [facility];  Service: Endoscopy;  Laterality: N/A;  12:45 PM  . COLONOSCOPY N/A 07/19/2018   Procedure: COLONOSCOPY;  Surgeon: Daneil Dolin, MD;  [facility];  Service: Endoscopy;  Laterality: N/A;  1:15pm  . MASS EXCISION  06/20/2012   Procedure: EXCISION MASS;  Surgeon: Scherry Ran, MD;  [facility];  Service: General;  Laterality: Left;  Excision of Mass Left Lower Quadrant  . MASS EXCISION N/A 05/17/2017   Procedure: EXCISION LIPOMA OF NECK;   Surgeon: Aviva Signs, MD;  [facility];  Service: General;  Laterality: N/A;  Pt notified to arrive at 7:20am per office  . NASAL POLYP SURGERY  2013   Dr. Redmond Pulling, ENT [facility])  . POLYPECTOMY  07/19/2018   Procedure: POLYPECTOMY;  Surgeon: Daneil Dolin, MD;  [facility];  Service: Endoscopy;;  colon  . SINUS ENDO W/FUSION Bilateral 07/24/2013   Procedure: ENDOSCOPIC SINUS SURGERY WITH  TOTAL ETHMOIDECTOMY, MAXILLARY ANTROSTOMY, AND FRONTAL RECESS EXPLORATION WITH FUSION NAVIGATION;  Surgeon: Ascencion Dike, MD;  Location: [place];  Service: ENT;  Laterality: Bilateral;  sinus       Family History  Problem Relation Age of Onset  . Asthma Mother   . Allergic rhinitis Sister   . Colon cancer Neg Hx   . Angioedema Neg Hx   . Atopy Neg Hx   . Eczema Neg Hx   . Immunodeficiency Neg Hx   . Urticaria Neg Hx     Social History   Tobacco Use  . Smoking status: Current Every Day Smoker    Packs/day: 0.10    Years: 25.00    Pack years: 2.50    Types: Cigarettes  . Smokeless tobacco: Never Used  Substance Use Topics  . Alcohol use: No  . Drug use: No    Home Medications Prior to Admission medications   Medication Sig Start Date  End Date Taking? Authorizing Provider  levocetirizine (XYZAL) 5 MG tablet Take 1 tablet by mouth daily. 11/29/18  Yes [provider]  montelukast (SINGULAIR) 10 MG tablet Take 10 mg by mouth at bedtime.   Yes [provider]  tadalafil (CIALIS) 5 MG tablet Take 5 mg by mouth daily as needed for erectile dysfunction.   Yes [provider]  tamsulosin (FLOMAX) 0.4 MG CAPS capsule Take 0.4 mg by mouth.   Yes [provider]  cephALEXin (KEFLEX) 500 MG capsule Take 1 capsule (500 mg total) by mouth 2 (two) times daily. 07/05/19   [name], MD    Allergies    Patient has no known allergies.  Review of Systems   Review of Systems  Constitutional: Negative for fever.  Respiratory:  Negative for shortness of breath.   Cardiovascular: Negative for chest pain.  Gastrointestinal: Negative for abdominal pain.  Genitourinary: Positive for frequency and hematuria. Negative for difficulty urinating, dysuria, penile pain and testicular pain.  All other systems reviewed and are negative.   Physical Exam Updated Vital Signs BP 132/82 (BP Location: Left Arm)   Pulse (!) 56   Temp 97.7 F (36.5 C) (Oral)   Resp 18   Ht 1.829 m (6')   Wt 113.9 kg   SpO2 97%   BMI 34.04 kg/m   Physical Exam CONSTITUTIONAL: Well developed/well nourished HEAD: Normocephalic/atraumatic EYES: EOMI NECK: supple no meningeal signs CV: S1/S2 noted, no murmurs/rubs/gallops noted LUNGS: Lungs are clear to auscultation bilaterally, no apparent distress ABDOMEN: soft, nontender GU:no cva tenderness, small amount of blood at the urethral meatus.  No discharge.  No scrotal tenderness.  Nurse Janett Billow present for exam NEURO: Pt is awake/alert/appropriate, moves all extremitiesx4.  No facial droop.   EXTREMITIES: full ROM SKIN: warm, color normal PSYCH: no abnormalities of mood noted, alert and oriented to situation  ED Results / Procedures / Treatments   Labs (all labs ordered are listed, but only abnormal results are displayed) Labs Reviewed  URINALYSIS, ROUTINE W REFLEX MICROSCOPIC - Abnormal; Notable for the following components:      Result Value   APPearance HAZY (*)    Hgb urine dipstick LARGE (*)    RBC / HPF >50 (*)    All other components within normal limits  URINE CULTURE    EKG None  Radiology No results found.  Procedures Procedures  Medications Ordered in ED Medications - No data to display  ED Course  I have reviewed the triage vital signs and the nursing notes.  Pertinent labs  results that were available during my care of the patient were reviewed by me and considered in my medical decision making (see chart for details).    MDM Rules/Calculators/A&P                       Patient presents for painless hematuria.  Urine culture sent, will start antibiotics.  He is awaiting a call from urology today about an appointment.  Advised he needs to have close follow-up as he may need to have a cystoscopy Patient with 0 mL on post void residual.  No signs of obstruction.  Will discharge home Final Clinical Impression(s) / ED Diagnoses Final diagnoses:  Gross hematuria    Rx / DC Orders ED Discharge Orders         Ordered    cephALEXin (KEFLEX) 500 MG capsule  2 times daily     07/05/19 0509  [name], MD 07/05/19 [phone]

## 2019-07-06 LAB — URINE CULTURE: Culture: NO GROWTH

## 2019-09-11 IMAGING — DX CHEST - 2 VIEW
2 series · 2 of 2 positions shown · non-contrast
Comparison: 03/23/2016

CLINICAL DATA: Sepsis

EXAM:
CHEST - 2 VIEW

[chest pa]
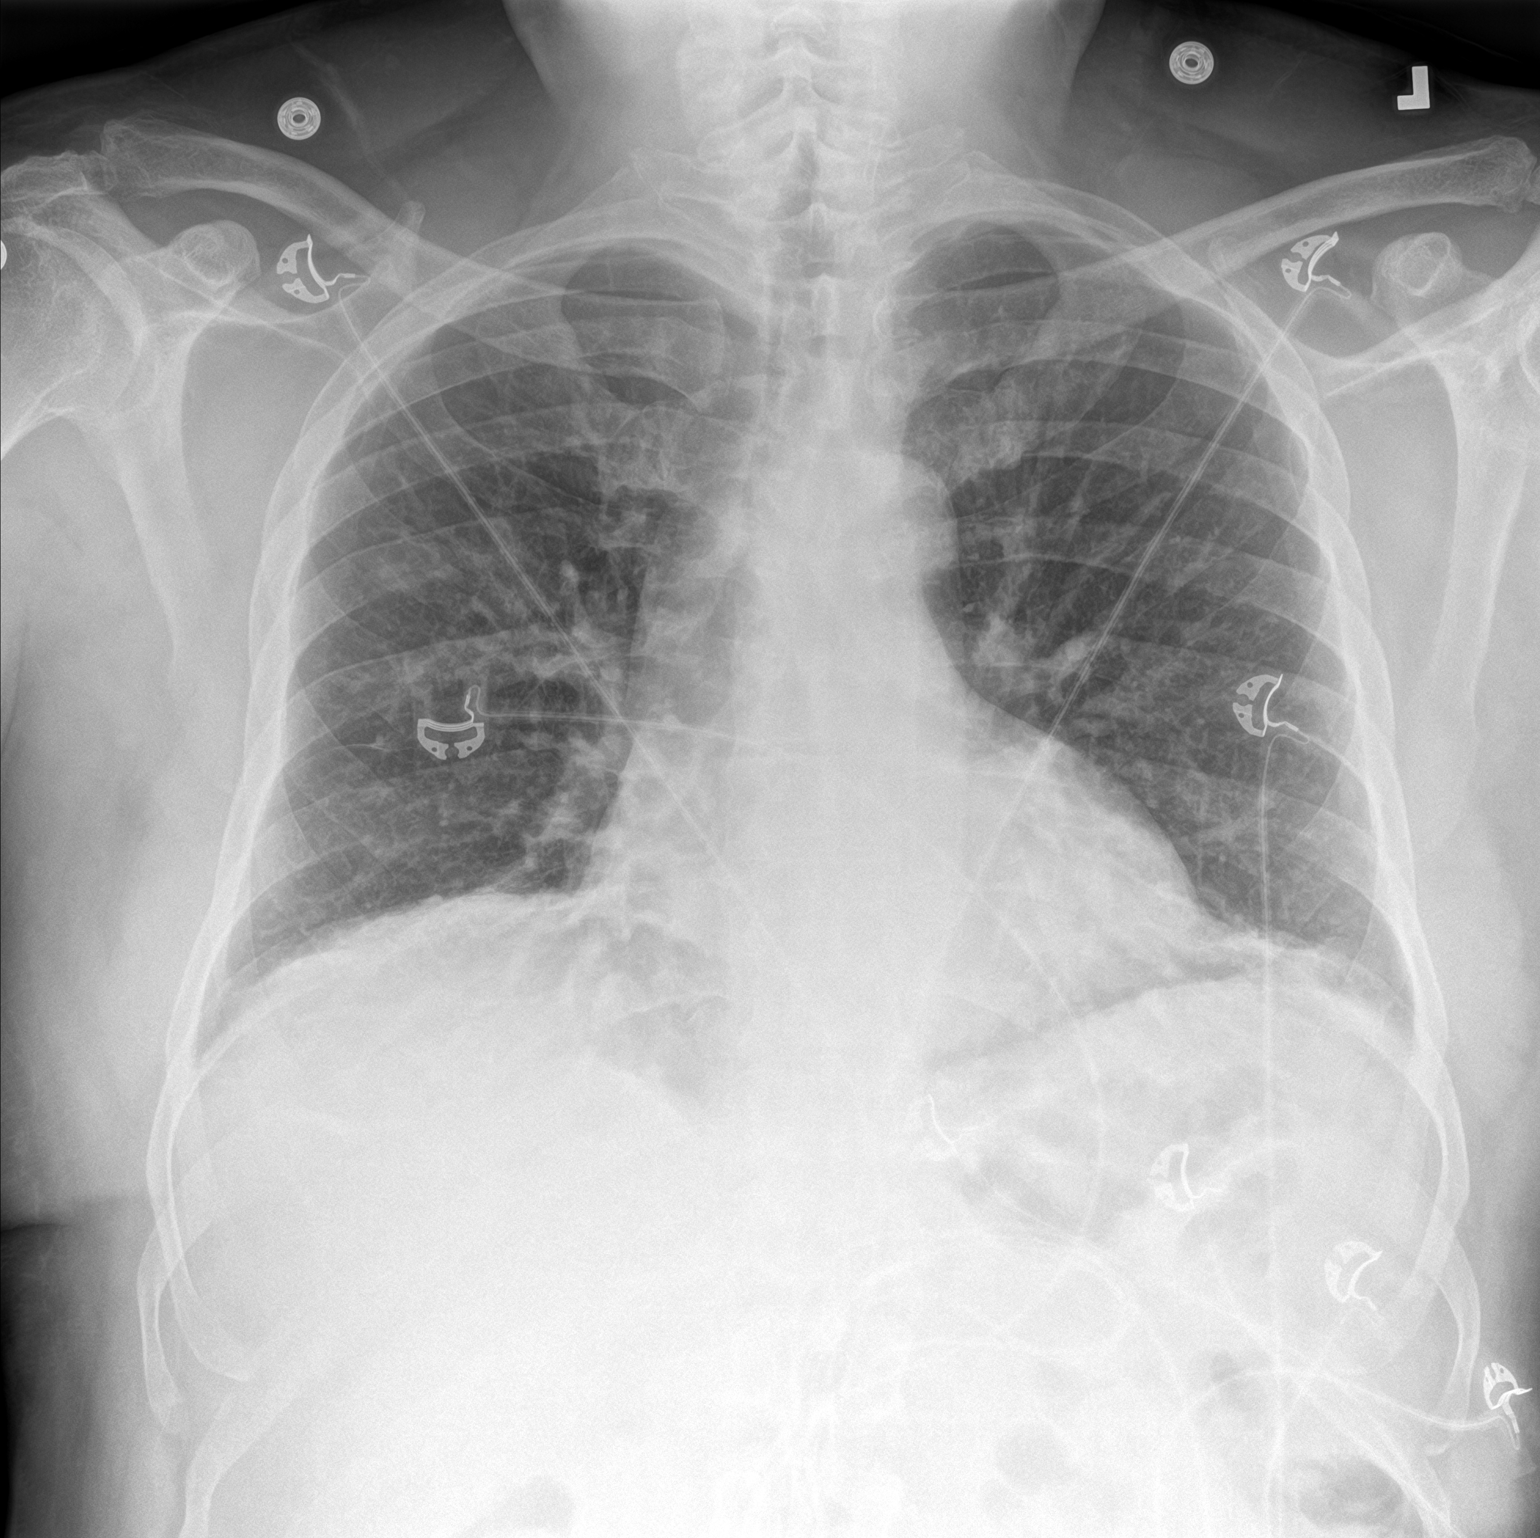

[chest lat]
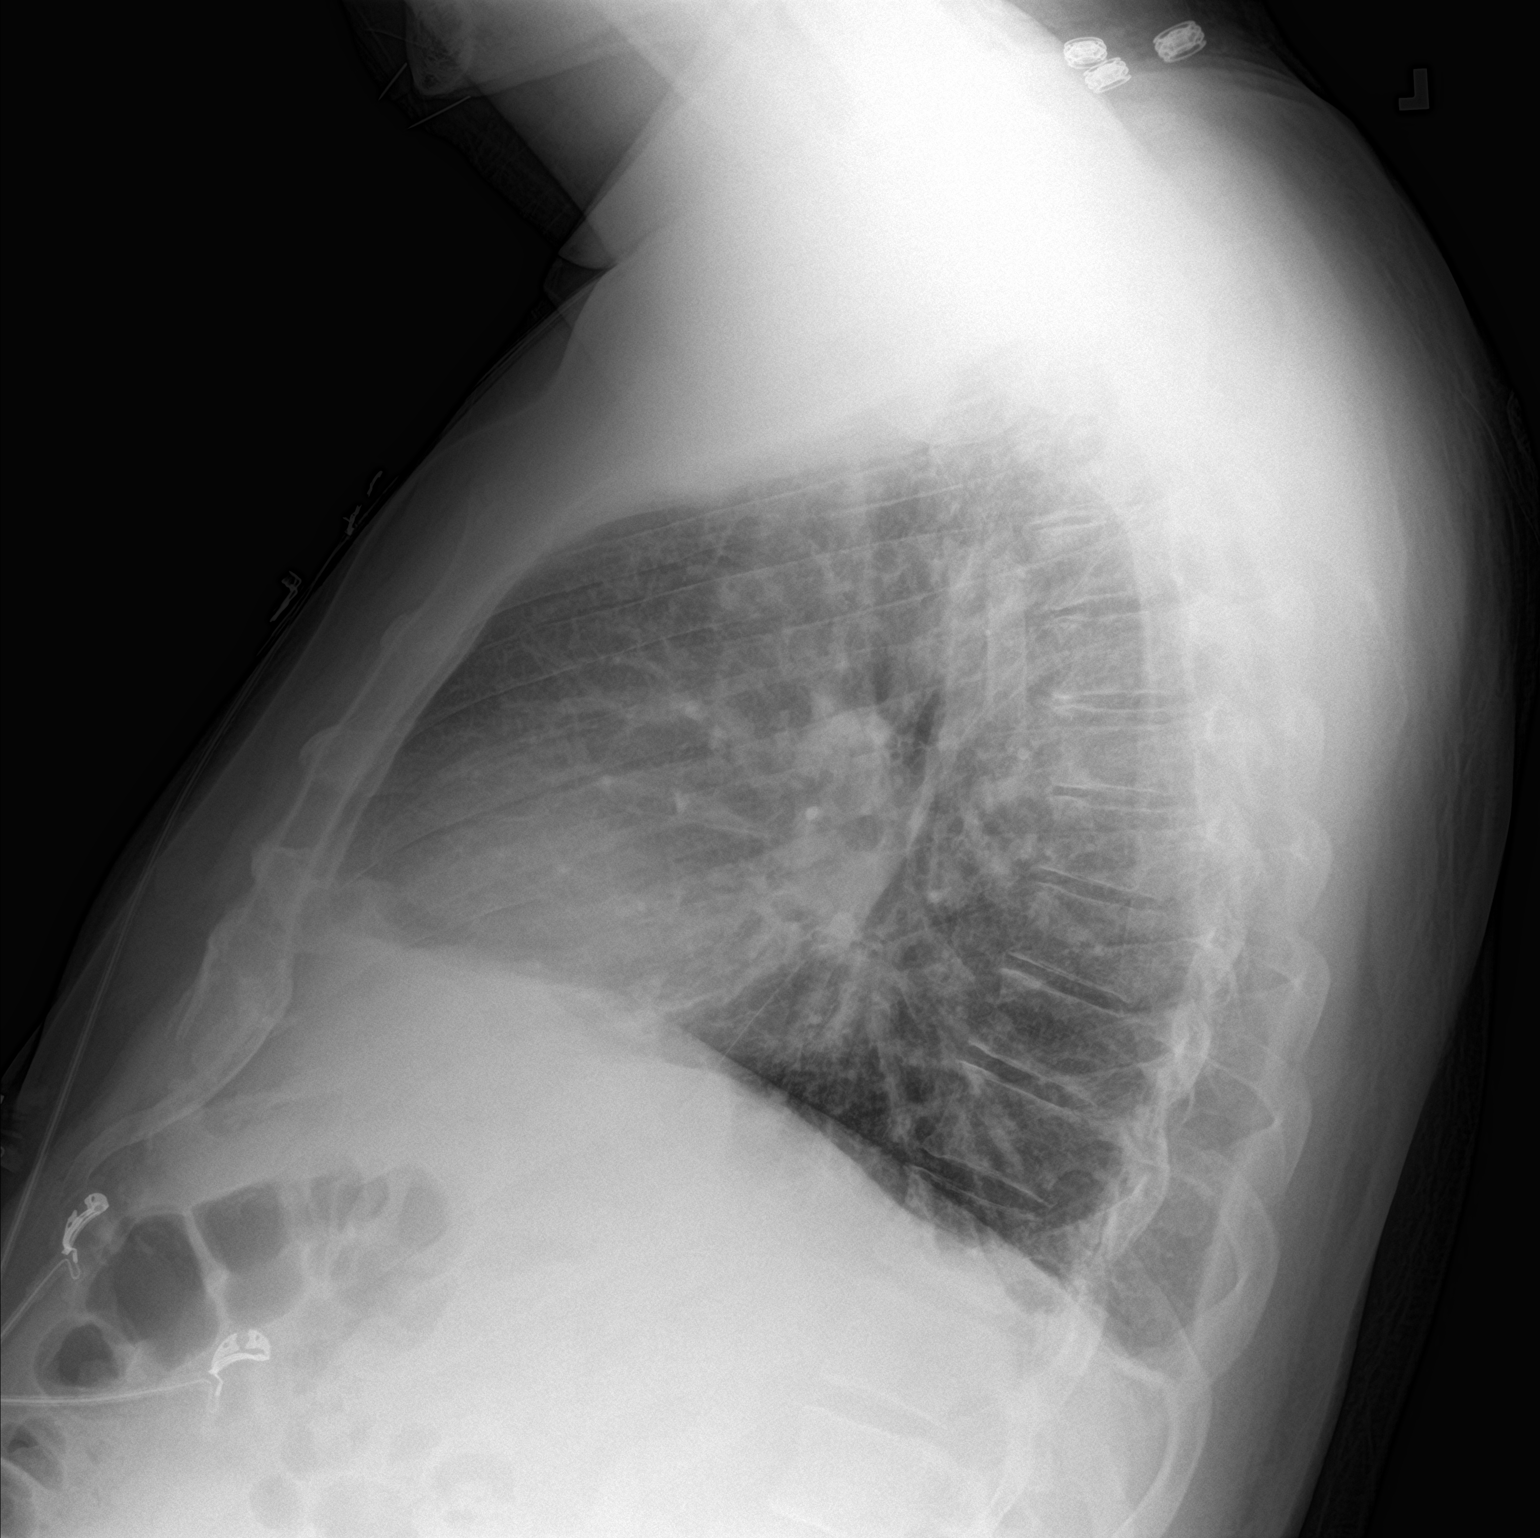

[2 of 2 positions shown; findings below may reference images not displayed]

FINDINGS: Left basilar atelectasis. Mild bilateral streaky opacities.
Cardiomediastinal contours are normal. No pleural effusion or
pneumothorax.
IMPRESSION: Left basilar atelectasis and indistinct bilateral streaky airspace
opacities which are nonspecific but could indicate bronchitis or
viral infection.

## 2022-06-08 DIAGNOSIS — Z419 Encounter for procedure for purposes other than remedying health state, unspecified: Secondary | ICD-10-CM | POA: Diagnosis not present

## 2022-07-09 DIAGNOSIS — Z419 Encounter for procedure for purposes other than remedying health state, unspecified: Secondary | ICD-10-CM | POA: Diagnosis not present

## 2022-08-07 DIAGNOSIS — Z419 Encounter for procedure for purposes other than remedying health state, unspecified: Secondary | ICD-10-CM | POA: Diagnosis not present

## 2022-08-26 DIAGNOSIS — J0191 Acute recurrent sinusitis, unspecified: Secondary | ICD-10-CM | POA: Diagnosis not present

## 2022-08-26 DIAGNOSIS — Z6832 Body mass index (BMI) 32.0-32.9, adult: Secondary | ICD-10-CM | POA: Diagnosis not present

## 2022-09-07 DIAGNOSIS — Z419 Encounter for procedure for purposes other than remedying health state, unspecified: Secondary | ICD-10-CM | POA: Diagnosis not present

## 2022-09-23 DIAGNOSIS — H5213 Myopia, bilateral: Secondary | ICD-10-CM | POA: Diagnosis not present

## 2022-10-07 DIAGNOSIS — Z419 Encounter for procedure for purposes other than remedying health state, unspecified: Secondary | ICD-10-CM | POA: Diagnosis not present

## 2022-10-09 ENCOUNTER — Ambulatory Visit (INDEPENDENT_AMBULATORY_CARE_PROVIDER_SITE_OTHER): Payer: Medicaid Other | Admitting: Urology

## 2022-10-09 ENCOUNTER — Encounter: Payer: Self-pay | Admitting: Urology

## 2022-10-09 VITALS — BP 130/83 | HR 65

## 2022-10-09 DIAGNOSIS — R350 Frequency of micturition: Secondary | ICD-10-CM

## 2022-10-09 DIAGNOSIS — N400 Enlarged prostate without lower urinary tract symptoms: Secondary | ICD-10-CM | POA: Diagnosis not present

## 2022-10-09 DIAGNOSIS — R351 Nocturia: Secondary | ICD-10-CM

## 2022-10-09 LAB — URINALYSIS, ROUTINE W REFLEX MICROSCOPIC
Bilirubin, UA: NEGATIVE
Glucose, UA: NEGATIVE
Ketones, UA: NEGATIVE
Leukocytes,UA: NEGATIVE
Nitrite, UA: NEGATIVE
Specific Gravity, UA: 1.02 (ref 1.005–1.030)
Urobilinogen, Ur: 1 mg/dL (ref 0.2–1.0)
pH, UA: 6 (ref 5.0–7.5)

## 2022-10-09 LAB — MICROSCOPIC EXAMINATION
Bacteria, UA: NONE SEEN
WBC, UA: NONE SEEN /hpf (ref 0–5)

## 2022-10-09 LAB — BLADDER SCAN AMB NON-IMAGING: Scan Result: 15

## 2022-10-09 MED ORDER — MIRABEGRON ER 25 MG PO TB24: 25.0000 mg | ORAL_TABLET | Freq: Every day | ORAL | 0 refills | Status: AC

## 2022-10-09 NOTE — Progress Notes (Signed)
post void residual=15 

## 2022-10-09 NOTE — Progress Notes (Unsigned)
10/09/2022 10:59 AM   Terry Gonzalez June 08, 1966 [id number]  Referring provider: Toma Deiters, MD [address] [place],  [place] [address]  BPH and urinary frequency   HPI: Terry Gonzalez is a 57yo here for evaluation of BPH and urinary frequency. He has a hx of aquablation of his prostate in 2021. Since the surgery he has noted urinary frequency, urgency and nocturia. He has nocturia 3-4x. He has urinary frequency every 1-2 hours. Urine stream strong. No straining to urinate. No dysuria or hematuria.    PMH: Past Medical History:  Diagnosis Date   Seasonal allergies    Sinusitis    Sleep apnea    uses a cpap    Surgical History: Past Surgical History:  Procedure Laterality Date   COLONOSCOPY N/A 05/20/2016   Procedure: COLONOSCOPY;  Surgeon: Corbin Ade, MD;  [facility];  Service: Endoscopy;  Laterality: N/A;  12:45 PM   COLONOSCOPY N/A 07/19/2018   Procedure: COLONOSCOPY;  Surgeon: Corbin Ade, MD;  [facility];  Service: Endoscopy;  Laterality: N/A;  1:15pm   MASS EXCISION  06/20/2012   Procedure: EXCISION MASS;  Surgeon: Marlane Hatcher, MD;  [facility];  Service: General;  Laterality: Left;  Excision of Mass Left Lower Quadrant   MASS EXCISION N/A 05/17/2017   Procedure: EXCISION LIPOMA OF NECK;  Surgeon: Franky Macho, MD;  [facility];  Service: General;  Laterality: N/A;  Pt notified to arrive at 7:20am per office   NASAL POLYP SURGERY  2013   Dr. Andrey Campanile, ENT [facility])   POLYPECTOMY  07/19/2018   Procedure: POLYPECTOMY;  Surgeon: Corbin Ade, MD;  [facility];  Service: Endoscopy;;  colon   SINUS ENDO W/FUSION Bilateral 07/24/2013   Procedure: ENDOSCOPIC SINUS SURGERY WITH  TOTAL ETHMOIDECTOMY, MAXILLARY ANTROSTOMY, AND FRONTAL RECESS EXPLORATION WITH FUSION NAVIGATION;  Surgeon: Darletta Moll, MD;  Location: [facility];  Service: ENT;  Laterality: Bilateral;  sinus    Home Medications:  Allergies as  of 10/09/2022   No Known Allergies      Medication List        Accurate as of Oct 09, 2022 10:59 AM. If you have any questions, ask your nurse or doctor.          cephALEXin 500 MG capsule Commonly known as: Keflex Take 1 capsule (500 mg total) by mouth 2 (two) times daily.   levocetirizine 5 MG tablet Commonly known as: XYZAL Take 1 tablet by mouth daily.   montelukast 10 MG tablet Commonly known as: SINGULAIR Take 10 mg by mouth at bedtime.   tadalafil 5 MG tablet Commonly known as: CIALIS Take 5 mg by mouth daily as needed for erectile dysfunction.   tamsulosin 0.4 MG Caps capsule Commonly known as: FLOMAX Take 0.4 mg by mouth.        Allergies: No Known Allergies  Family History: Family History  Problem Relation Age of Onset   Asthma Mother    Allergic rhinitis Sister    Colon cancer Neg Hx    Angioedema Neg Hx    Atopy Neg Hx    Eczema Neg Hx    Immunodeficiency Neg Hx    Urticaria Neg Hx     Social History:  reports that he has been smoking cigarettes. He has a 2.50 pack-year smoking history. He has never used smokeless tobacco. He reports that he does not drink alcohol and does not use drugs.  ROS: All other review of systems were reviewed and are negative except what is noted above in HPI  Physical Exam: BP 130/83   Pulse 65   Constitutional:  Alert and oriented, No acute distress. HEENT: [place] AT, moist mucus membranes.  Trachea midline, no masses. Cardiovascular: No clubbing, cyanosis, or edema. Respiratory: Normal respiratory effort, no increased work of breathing. GI: Abdomen is soft, nontender, nondistended, no abdominal masses GU: No CVA tenderness.  Lymph: No cervical or inguinal lymphadenopathy. Skin: No rashes, bruises or suspicious lesions. Neurologic: Grossly intact, no focal deficits, moving all 4 extremities. Psychiatric: Normal mood and affect.  Laboratory Data: Lab Results  Component Value Date   WBC 2.2 (L) 12/03/2018    HGB 14.5 12/03/2018   HCT 42.1 12/03/2018   MCV 86.6 12/03/2018   PLT 131 (L) 12/03/2018    Lab Results  Component Value Date   CREATININE 0.90 12/03/2018    No results found for: "PSA"  No results found for: "TESTOSTERONE"  No results found for: "HGBA1C"  Urinalysis    Component Value Date/Time   COLORURINE YELLOW 07/05/2019 0442   APPEARANCEUR HAZY (A) 07/05/2019 0442   LABSPEC 1.015 07/05/2019 0442   PHURINE 6.0 07/05/2019 0442   GLUCOSEU NEGATIVE 07/05/2019 0442   HGBUR LARGE (A) 07/05/2019 0442   BILIRUBINUR NEGATIVE 07/05/2019 0442   KETONESUR NEGATIVE 07/05/2019 0442   PROTEINUR NEGATIVE 07/05/2019 0442   UROBILINOGEN 0.2 03/14/2015 0018   NITRITE NEGATIVE 07/05/2019 0442   LEUKOCYTESUR NEGATIVE 07/05/2019 0442    Lab Results  Component Value Date   BACTERIA NONE SEEN 07/05/2019    Pertinent Imaging: *** No results found for this or any previous visit.  No results found for this or any previous visit.  No results found for this or any previous visit.  No results found for this or any previous visit.  No results found for this or any previous visit.  No valid procedures specified. No results found for this or any previous visit.  No results found for this or any previous visit.   Assessment & Plan:    1. Enlarged prostate *** - Urinalysis, Routine w reflex microscopic - BLADDER SCAN AMB NON-IMAGING  2. Nocturia ***  3. Urinary frequency -we will trial mirabegron 25mg  daily   No follow-ups on file.  Wilkie Aye, MD  [facility] [place]

## 2022-10-09 NOTE — Patient Instructions (Signed)

## 2022-11-04 ENCOUNTER — Ambulatory Visit (INDEPENDENT_AMBULATORY_CARE_PROVIDER_SITE_OTHER): Payer: Medicaid Other | Admitting: Urology

## 2022-11-04 ENCOUNTER — Encounter: Payer: Self-pay | Admitting: Urology

## 2022-11-04 VITALS — BP 126/80 | HR 60

## 2022-11-04 DIAGNOSIS — R350 Frequency of micturition: Secondary | ICD-10-CM | POA: Diagnosis not present

## 2022-11-04 DIAGNOSIS — R351 Nocturia: Secondary | ICD-10-CM | POA: Diagnosis not present

## 2022-11-04 LAB — BLADDER SCAN AMB NON-IMAGING

## 2022-11-04 MED ORDER — SOLIFENACIN SUCCINATE 5 MG PO TABS: 5.0000 mg | ORAL_TABLET | Freq: Every day | ORAL | 5 refills | Status: AC

## 2022-11-04 NOTE — Progress Notes (Signed)
11/04/2022 4:53 PM   Terry Gonzalez 1966-01-24 [id number]  Referring provider: Toma Deiters, MD [address] [place],  [place] [address]  Urinary frequency   HPI: Terry Gonzalez is a 57yo here for followup for urinary frequency, urgency and nocturia. Mirabegron failed to improve his urinary urgency and frequency. He has occasional urge incontinence. No strainign to urinate. Urine stream strong. Nocturia 3-5x.    PMH: Past Medical History:  Diagnosis Date   Seasonal allergies    Sinusitis    Sleep apnea    uses a cpap    Surgical History: Past Surgical History:  Procedure Laterality Date   COLONOSCOPY N/A 05/20/2016   Procedure: COLONOSCOPY;  Surgeon: Corbin Ade, MD;  [facility];  Service: Endoscopy;  Laterality: N/A;  12:45 PM   COLONOSCOPY N/A 07/19/2018   Procedure: COLONOSCOPY;  Surgeon: Corbin Ade, MD;  [facility]: [facility];  Service: Endoscopy;  Laterality: N/A;  1:15pm   MASS EXCISION  06/20/2012   Procedure: EXCISION MASS;  Surgeon: Marlane Hatcher, MD;  Location: [facility];  Service: General;  Laterality: Left;  Excision of Mass Left Lower Quadrant   MASS EXCISION N/A 05/17/2017   Procedure: EXCISION LIPOMA OF NECK;  Surgeon: Franky Macho, MD;  Location: [facility];  Service: General;  Laterality: N/A;  Pt notified to arrive at 7:20am per office   NASAL POLYP SURGERY  2013   Dr. Andrey Campanile, ENT [facility])   POLYPECTOMY  07/19/2018   Procedure: POLYPECTOMY;  Surgeon: Corbin Ade, MD;  [facility];  Service: Endoscopy;;  colon   SINUS ENDO W/FUSION Bilateral 07/24/2013   Procedure: ENDOSCOPIC SINUS SURGERY WITH  TOTAL ETHMOIDECTOMY, MAXILLARY ANTROSTOMY, AND FRONTAL RECESS EXPLORATION WITH FUSION NAVIGATION;  Surgeon: Darletta Moll, MD;  Location: [facility];  Service: ENT;  Laterality: Bilateral;  sinus    Home Medications:  Allergies as of 11/04/2022   No Known Allergies      Medication List        Accurate as of Nov 04, 2022  4:53 PM. If you have any questions, ask your nurse or doctor.          STOP taking these medications    cephALEXin 500 MG capsule Commonly known as: Keflex Stopped by: Wilkie Aye, MD   levocetirizine 5 MG tablet Commonly known as: XYZAL Stopped by: Wilkie Aye, MD   montelukast 10 MG tablet Commonly known as: SINGULAIR Stopped by: Wilkie Aye, MD   tadalafil 5 MG tablet Commonly known as: CIALIS Stopped by: Wilkie Aye, MD   tamsulosin 0.4 MG Caps capsule Commonly known as: FLOMAX Stopped by: Wilkie Aye, MD       TAKE these medications    mirabegron ER 25 MG Tb24 tablet Commonly known as: MYRBETRIQ Take 1 tablet (25 mg total) by mouth daily.        Allergies: No Known Allergies  Family History: Family History  Problem Relation Age of Onset   Asthma Mother    Allergic rhinitis Sister    Colon cancer Neg Hx    Angioedema Neg Hx    Atopy Neg Hx    Eczema Neg Hx    Immunodeficiency Neg Hx    Urticaria Neg Hx     Social History:  reports that he has been smoking cigarettes. He has a 2.50 pack-year smoking history. He has never used smokeless tobacco. He reports that he does not drink alcohol and does not use drugs.  ROS: All other review of systems were reviewed and are negative except what is noted above in HPI  Physical Exam: BP 126/80   Pulse 60   Constitutional:  Alert and oriented, No acute distress. HEENT: [place] AT, moist mucus membranes.  Trachea midline, no masses. Cardiovascular: No clubbing, cyanosis, or edema. Respiratory: Normal respiratory effort, no increased work of breathing. GI: Abdomen is soft, nontender, nondistended, no abdominal masses GU: No CVA tenderness.  Lymph: No cervical or inguinal lymphadenopathy. Skin: No rashes, bruises or suspicious lesions. Neurologic: Grossly intact, no focal deficits, moving all 4 extremities. Psychiatric: Normal mood and affect.  Laboratory Data: Lab Results   Component Value Date   WBC 2.2 (L) 12/03/2018   HGB 14.5 12/03/2018   HCT 42.1 12/03/2018   MCV 86.6 12/03/2018   PLT 131 (L) 12/03/2018    Lab Results  Component Value Date   CREATININE 0.90 12/03/2018    No results found for: "PSA"  No results found for: "TESTOSTERONE"  No results found for: "HGBA1C"  Urinalysis    Component Value Date/Time   COLORURINE YELLOW 07/05/2019 0442   APPEARANCEUR Clear 10/09/2022 1056   LABSPEC 1.015 07/05/2019 0442   PHURINE 6.0 07/05/2019 0442   GLUCOSEU Negative 10/09/2022 1056   HGBUR LARGE (A) 07/05/2019 0442   BILIRUBINUR Negative 10/09/2022 1056   KETONESUR NEGATIVE 07/05/2019 0442   PROTEINUR 1+ (A) 10/09/2022 1056   PROTEINUR NEGATIVE 07/05/2019 0442   UROBILINOGEN 0.2 03/14/2015 0018   NITRITE Negative 10/09/2022 1056   NITRITE NEGATIVE 07/05/2019 0442   LEUKOCYTESUR Negative 10/09/2022 1056   LEUKOCYTESUR NEGATIVE 07/05/2019 0442    Lab Results  Component Value Date   LABMICR See below: 10/09/2022   WBCUA None seen 10/09/2022   LABEPIT 0-10 10/09/2022   BACTERIA None seen 10/09/2022    Pertinent Imaging:  No results found for this or any previous visit.  No results found for this or any previous visit.  No results found for this or any previous visit.  No results found for this or any previous visit.  No results found for this or any previous visit.  No valid procedures specified. No results found for this or any previous visit.  No results found for this or any previous visit.   Assessment & Plan:    1. Urinary frequency -We will trial vesicare 5mg  daily - Urinalysis, Routine w reflex microscopic - Bladder Scan (Post Void Residual) in office  2. Nocturia -vesicare 5mg  daily   No follow-ups on file.  Wilkie Aye, MD  [facility] [place]

## 2022-11-05 ENCOUNTER — Encounter: Payer: Self-pay | Admitting: Urology

## 2022-11-05 LAB — MICROSCOPIC EXAMINATION: Bacteria, UA: NONE SEEN

## 2022-11-05 LAB — URINALYSIS, ROUTINE W REFLEX MICROSCOPIC
Bilirubin, UA: NEGATIVE
Glucose, UA: NEGATIVE
Ketones, UA: NEGATIVE
Leukocytes,UA: NEGATIVE
Nitrite, UA: NEGATIVE
Protein,UA: NEGATIVE
Specific Gravity, UA: 1.02 (ref 1.005–1.030)
Urobilinogen, Ur: 0.2 mg/dL (ref 0.2–1.0)
pH, UA: 5.5 (ref 5.0–7.5)

## 2022-11-05 NOTE — Patient Instructions (Signed)

## 2022-11-07 DIAGNOSIS — Z419 Encounter for procedure for purposes other than remedying health state, unspecified: Secondary | ICD-10-CM | POA: Diagnosis not present

## 2022-12-07 DIAGNOSIS — Z419 Encounter for procedure for purposes other than remedying health state, unspecified: Secondary | ICD-10-CM | POA: Diagnosis not present

## 2022-12-23 ENCOUNTER — Ambulatory Visit: Payer: Medicaid Other | Admitting: Urology

## 2022-12-23 DIAGNOSIS — R350 Frequency of micturition: Secondary | ICD-10-CM

## 2023-01-07 DIAGNOSIS — Z419 Encounter for procedure for purposes other than remedying health state, unspecified: Secondary | ICD-10-CM | POA: Diagnosis not present

## 2023-02-07 DIAGNOSIS — Z419 Encounter for procedure for purposes other than remedying health state, unspecified: Secondary | ICD-10-CM | POA: Diagnosis not present

## 2023-02-23 DIAGNOSIS — Z0000 Encounter for general adult medical examination without abnormal findings: Secondary | ICD-10-CM | POA: Diagnosis not present

## 2023-02-23 DIAGNOSIS — Z6833 Body mass index (BMI) 33.0-33.9, adult: Secondary | ICD-10-CM | POA: Diagnosis not present

## 2023-03-09 DIAGNOSIS — Z419 Encounter for procedure for purposes other than remedying health state, unspecified: Secondary | ICD-10-CM | POA: Diagnosis not present

## 2023-03-22 DIAGNOSIS — J339 Nasal polyp, unspecified: Secondary | ICD-10-CM | POA: Diagnosis not present

## 2023-03-22 DIAGNOSIS — H9193 Unspecified hearing loss, bilateral: Secondary | ICD-10-CM | POA: Diagnosis not present

## 2023-04-09 DIAGNOSIS — Z419 Encounter for procedure for purposes other than remedying health state, unspecified: Secondary | ICD-10-CM | POA: Diagnosis not present

## 2023-05-05 DIAGNOSIS — F17210 Nicotine dependence, cigarettes, uncomplicated: Secondary | ICD-10-CM | POA: Diagnosis not present

## 2023-05-05 DIAGNOSIS — Z01118 Encounter for examination of ears and hearing with other abnormal findings: Secondary | ICD-10-CM | POA: Diagnosis not present

## 2023-05-05 DIAGNOSIS — J339 Nasal polyp, unspecified: Secondary | ICD-10-CM | POA: Diagnosis not present

## 2023-05-05 DIAGNOSIS — H906 Mixed conductive and sensorineural hearing loss, bilateral: Secondary | ICD-10-CM | POA: Diagnosis not present

## 2023-05-09 DIAGNOSIS — Z419 Encounter for procedure for purposes other than remedying health state, unspecified: Secondary | ICD-10-CM | POA: Diagnosis not present

## 2023-05-20 DIAGNOSIS — J339 Nasal polyp, unspecified: Secondary | ICD-10-CM | POA: Diagnosis not present

## 2023-06-09 DIAGNOSIS — Z419 Encounter for procedure for purposes other than remedying health state, unspecified: Secondary | ICD-10-CM | POA: Diagnosis not present

## 2023-06-17 ENCOUNTER — Encounter: Payer: Self-pay | Admitting: *Deleted

## 2023-06-29 DIAGNOSIS — N3942 Incontinence without sensory awareness: Secondary | ICD-10-CM | POA: Diagnosis not present

## 2023-06-29 DIAGNOSIS — Z6833 Body mass index (BMI) 33.0-33.9, adult: Secondary | ICD-10-CM | POA: Diagnosis not present

## 2023-06-29 DIAGNOSIS — N400 Enlarged prostate without lower urinary tract symptoms: Secondary | ICD-10-CM | POA: Diagnosis not present

## 2023-06-29 DIAGNOSIS — J301 Allergic rhinitis due to pollen: Secondary | ICD-10-CM | POA: Diagnosis not present

## 2023-07-10 DIAGNOSIS — Z419 Encounter for procedure for purposes other than remedying health state, unspecified: Secondary | ICD-10-CM | POA: Diagnosis not present

## 2023-08-07 DIAGNOSIS — Z419 Encounter for procedure for purposes other than remedying health state, unspecified: Secondary | ICD-10-CM | POA: Diagnosis not present

## 2023-08-24 DIAGNOSIS — N401 Enlarged prostate with lower urinary tract symptoms: Secondary | ICD-10-CM | POA: Diagnosis not present

## 2023-08-30 DIAGNOSIS — J339 Nasal polyp, unspecified: Secondary | ICD-10-CM | POA: Diagnosis not present

## 2023-09-18 DIAGNOSIS — Z419 Encounter for procedure for purposes other than remedying health state, unspecified: Secondary | ICD-10-CM | POA: Diagnosis not present

## 2023-10-18 DIAGNOSIS — Z419 Encounter for procedure for purposes other than remedying health state, unspecified: Secondary | ICD-10-CM | POA: Diagnosis not present

## 2023-11-18 DIAGNOSIS — Z419 Encounter for procedure for purposes other than remedying health state, unspecified: Secondary | ICD-10-CM | POA: Diagnosis not present

## 2023-11-29 DIAGNOSIS — J339 Nasal polyp, unspecified: Secondary | ICD-10-CM | POA: Diagnosis not present

## 2023-12-18 DIAGNOSIS — Z419 Encounter for procedure for purposes other than remedying health state, unspecified: Secondary | ICD-10-CM | POA: Diagnosis not present

## 2023-12-21 DIAGNOSIS — N401 Enlarged prostate with lower urinary tract symptoms: Secondary | ICD-10-CM | POA: Diagnosis not present

## 2024-01-18 DIAGNOSIS — Z419 Encounter for procedure for purposes other than remedying health state, unspecified: Secondary | ICD-10-CM | POA: Diagnosis not present

## 2024-01-25 DIAGNOSIS — J330 Polyp of nasal cavity: Secondary | ICD-10-CM | POA: Diagnosis not present

## 2024-01-25 DIAGNOSIS — J302 Other seasonal allergic rhinitis: Secondary | ICD-10-CM | POA: Diagnosis not present

## 2024-01-25 DIAGNOSIS — J3089 Other allergic rhinitis: Secondary | ICD-10-CM | POA: Diagnosis not present

## 2024-02-03 DIAGNOSIS — N3942 Incontinence without sensory awareness: Secondary | ICD-10-CM | POA: Diagnosis not present

## 2024-02-03 DIAGNOSIS — N401 Enlarged prostate with lower urinary tract symptoms: Secondary | ICD-10-CM | POA: Diagnosis not present

## 2024-02-03 DIAGNOSIS — Z6833 Body mass index (BMI) 33.0-33.9, adult: Secondary | ICD-10-CM | POA: Diagnosis not present

## 2024-02-03 DIAGNOSIS — J301 Allergic rhinitis due to pollen: Secondary | ICD-10-CM | POA: Diagnosis not present

## 2024-02-03 DIAGNOSIS — N400 Enlarged prostate without lower urinary tract symptoms: Secondary | ICD-10-CM | POA: Diagnosis not present

## 2024-02-18 DIAGNOSIS — Z419 Encounter for procedure for purposes other than remedying health state, unspecified: Secondary | ICD-10-CM | POA: Diagnosis not present

## 2024-03-19 DIAGNOSIS — Z419 Encounter for procedure for purposes other than remedying health state, unspecified: Secondary | ICD-10-CM | POA: Diagnosis not present

## 2024-04-03 DIAGNOSIS — N138 Other obstructive and reflux uropathy: Secondary | ICD-10-CM | POA: Diagnosis not present

## 2024-04-03 DIAGNOSIS — N401 Enlarged prostate with lower urinary tract symptoms: Secondary | ICD-10-CM | POA: Diagnosis not present

## 2024-04-03 DIAGNOSIS — N400 Enlarged prostate without lower urinary tract symptoms: Secondary | ICD-10-CM | POA: Diagnosis not present

## 2024-04-03 DIAGNOSIS — N410 Acute prostatitis: Secondary | ICD-10-CM | POA: Diagnosis not present

## 2024-04-03 DIAGNOSIS — N411 Chronic prostatitis: Secondary | ICD-10-CM | POA: Diagnosis not present

## 2024-04-19 DIAGNOSIS — J3089 Other allergic rhinitis: Secondary | ICD-10-CM | POA: Diagnosis not present

## 2024-04-19 DIAGNOSIS — J302 Other seasonal allergic rhinitis: Secondary | ICD-10-CM | POA: Diagnosis not present

## 2024-04-19 DIAGNOSIS — J330 Polyp of nasal cavity: Secondary | ICD-10-CM | POA: Diagnosis not present

## 2024-04-19 DIAGNOSIS — Z419 Encounter for procedure for purposes other than remedying health state, unspecified: Secondary | ICD-10-CM | POA: Diagnosis not present

## 2024-04-25 DIAGNOSIS — J302 Other seasonal allergic rhinitis: Secondary | ICD-10-CM | POA: Diagnosis not present

## 2024-04-25 DIAGNOSIS — J330 Polyp of nasal cavity: Secondary | ICD-10-CM | POA: Diagnosis not present

## 2024-05-02 DIAGNOSIS — J339 Nasal polyp, unspecified: Secondary | ICD-10-CM | POA: Diagnosis not present

## 2024-05-09 DIAGNOSIS — J339 Nasal polyp, unspecified: Secondary | ICD-10-CM | POA: Diagnosis not present

## 2024-05-09 DIAGNOSIS — J330 Polyp of nasal cavity: Secondary | ICD-10-CM | POA: Diagnosis not present

## 2024-05-10 DIAGNOSIS — Z6834 Body mass index (BMI) 34.0-34.9, adult: Secondary | ICD-10-CM | POA: Diagnosis not present

## 2024-05-10 DIAGNOSIS — F524 Premature ejaculation: Secondary | ICD-10-CM | POA: Diagnosis not present

## 2024-05-10 DIAGNOSIS — J302 Other seasonal allergic rhinitis: Secondary | ICD-10-CM | POA: Diagnosis not present

## 2024-05-10 DIAGNOSIS — N400 Enlarged prostate without lower urinary tract symptoms: Secondary | ICD-10-CM | POA: Diagnosis not present

## 2024-05-10 DIAGNOSIS — N3942 Incontinence without sensory awareness: Secondary | ICD-10-CM | POA: Diagnosis not present
